# Patient Record
Sex: Female | Born: 1980 | Hispanic: No | Marital: Married | Smoking: Current every day smoker
Health system: Southern US, Community
[De-identification: ages and names within clinical notes are randomized; demographics above are authoritative.]

## PROBLEM LIST (undated history)

## (undated) DIAGNOSIS — F411 Generalized anxiety disorder: Secondary | ICD-10-CM

## (undated) DIAGNOSIS — K219 Gastro-esophageal reflux disease without esophagitis: Secondary | ICD-10-CM

## (undated) HISTORY — PX: TONSILLECTOMY: SUR1361

## (undated) HISTORY — PX: APPENDECTOMY: SHX54

## (undated) HISTORY — PX: WISDOM TOOTH EXTRACTION: SHX21

---
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
## 2013-03-18 ENCOUNTER — Emergency Department (INDEPENDENT_AMBULATORY_CARE_PROVIDER_SITE_OTHER)
Admission: EM | Admit: 2013-03-18 | Discharge: 2013-03-18 | Disposition: A | Discharge status: Home / Self Care | Payer: 59 | Source: Home / Self Care | Attending: Emergency Medicine | Admitting: Emergency Medicine

## 2013-03-18 ENCOUNTER — Encounter: Payer: Self-pay | Admitting: Emergency Medicine

## 2013-03-18 DIAGNOSIS — H109 Unspecified conjunctivitis: Secondary | ICD-10-CM

## 2013-03-18 DIAGNOSIS — Z23 Encounter for immunization: Secondary | ICD-10-CM

## 2013-03-18 HISTORY — DX: Gastro-esophageal reflux disease without esophagitis: K21.9

## 2013-03-18 MED ORDER — INFLUENZA VAC SPLIT QUAD 0.5 ML IM SUSP
0.5000 mL | Freq: Once | INTRAMUSCULAR | Status: AC
  Administered 2013-03-18: 0.5 mL via INTRAMUSCULAR

## 2013-03-18 MED ORDER — POLYMYXIN B-TRIMETHOPRIM 10000-0.1 UNIT/ML-% OP SOLN: OPHTHALMIC | Status: DC
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
## 2013-05-03 ENCOUNTER — Emergency Department (INDEPENDENT_AMBULATORY_CARE_PROVIDER_SITE_OTHER): Payer: 59

## 2013-05-03 ENCOUNTER — Emergency Department (INDEPENDENT_AMBULATORY_CARE_PROVIDER_SITE_OTHER)
Admission: EM | Admit: 2013-05-03 | Discharge: 2013-05-03 | Disposition: A | Discharge status: Home / Self Care | Payer: 59 | Source: Home / Self Care | Attending: Family Medicine | Admitting: Family Medicine

## 2013-05-03 ENCOUNTER — Encounter: Payer: Self-pay | Admitting: Emergency Medicine

## 2013-05-03 DIAGNOSIS — IMO0002 Reserved for concepts with insufficient information to code with codable children: Secondary | ICD-10-CM

## 2013-05-03 DIAGNOSIS — S3992XA Unspecified injury of lower back, initial encounter: Secondary | ICD-10-CM

## 2013-05-03 DIAGNOSIS — M545 Low back pain: Secondary | ICD-10-CM

## 2013-05-03 DIAGNOSIS — M533 Sacrococcygeal disorders, not elsewhere classified: Secondary | ICD-10-CM

## 2013-05-03 MED ORDER — HYDROCODONE-ACETAMINOPHEN 5-325 MG PO TABS: ORAL_TABLET | ORAL | Status: DC

## 2013-05-03 MED ORDER — METAXALONE 800 MG PO TABS: 800.0000 mg | ORAL_TABLET | Freq: Three times a day (TID) | ORAL | Status: DC

## 2013-05-03 MED ORDER — MELOXICAM 15 MG PO TABS: 15.0000 mg | ORAL_TABLET | Freq: Every day | ORAL | Status: DC

## 2013-05-03 MED ORDER — IBUPROFEN 800 MG PO TABS
800.0000 mg | ORAL_TABLET | Freq: Once | ORAL | Status: AC
  Administered 2013-05-03: 800 mg via ORAL

## 2013-05-03 NOTE — ED Notes (Signed)
Tammy Werner was bent over getting laundry out of the dryer yesterday AM, when she stood back up her back "caught" and she couldn't and hasnt been able to stand all the way up. Pain is in her lower back. No injury, no previous surgeries, etc. She has taken tylenol and and old rx of Vicodin for pain relief. Pain is 10/10 with activity and 5/10 @ rest.

## 2013-05-03 NOTE — ED Provider Notes (Signed)
CSN: [id number]     Arrival date & time 05/03/13  1133 History   First MD Initiated Contact with Patient 05/03/13 1152     Chief Complaint  Patient presents with  . Back Pain      HPI Comments:   Tammy Werner was bent over getting laundry out of the dryer yesterday AM.  When she stood back up her back "caught"  Pain is in her lower back worse on the left and she has had persistent lower back pain radiating to her buttocks.  Her pain is worse standing and sitting, and better in a fetal position on her side.  No bowel or bladder dysfunction.  Her pain improved with Tylenol and Vicodin.    Patient is a 32 y.o. female presenting with back pain. The history is provided by the patient.  Back Pain Location:  Lumbar spine Quality:  Burning and stabbing Radiates to: buttocs. Pain severity:  Severe Pain is:  Same all the time Onset quality:  Sudden Duration:  15 hours Timing:  Constant Progression:  Unchanged Chronicity:  New Relieved by:  Lying down and narcotics Worsened by:  Bending, ambulation, movement, sitting, standing and twisting Associated symptoms: no abdominal pain, no abdominal swelling, no bladder incontinence, no bowel incontinence, no chest pain, no dysuria, no fever, no headaches, no leg pain, no numbness, no paresthesias, no pelvic pain, no perianal numbness, no tingling, no weakness and no weight loss     Past Medical History  Diagnosis Date  . GERD (gastroesophageal reflux disease)    Past Surgical History  Procedure Laterality Date  . Tonsillectomy    . Appendectomy    . Wisdom tooth extraction     History reviewed. No pertinent family history. History  Substance Use Topics  . Smoking status: Current Every Day Smoker -- 0.50 packs/day    Types: Cigarettes  . Smokeless tobacco: Never Used  . Alcohol Use: Yes   OB History   Grav Para Term Preterm Abortions TAB SAB Ect Mult Living                 Review of Systems  Constitutional: Negative for fever and weight  loss.  Cardiovascular: Negative for chest pain.  Gastrointestinal: Negative for abdominal pain and bowel incontinence.  Genitourinary: Negative for bladder incontinence, dysuria and pelvic pain.  Musculoskeletal: Positive for back pain.  Neurological: Negative for tingling, weakness, numbness, headaches and paresthesias.    Allergies  Review of patient's allergies indicates no known allergies.  Home Medications   Current Outpatient Rx  Name  Route  Sig  Dispense  Refill  . HYDROcodone-acetaminophen (NORCO/VICODIN) 5-325 MG per tablet      Take one by mouth at bedtime as needed for pain   7 tablet   0   . meloxicam (MOBIC) 15 MG tablet   Oral   Take 1 tablet (15 mg total) by mouth daily. Take with food each morning   15 tablet   0   . metaxalone (SKELAXIN) 800 MG tablet   Oral   Take 1 tablet (800 mg total) by mouth 3 (three) times daily.   30 tablet   0    BP 125/77  Pulse 83  Resp 16  Ht 5\' 4"  (1.626 m)  Wt 145 lb (65.772 kg)  BMI 24.88 kg/m2  SpO2 100%  LMP 05/03/2013 Physical Exam  Nursing note and vitals reviewed. Constitutional: She is oriented to person, place, and time. She appears well-developed and well-nourished. No distress.  HENT:  Head: Atraumatic.  Eyes: Conjunctivae are normal. Pupils are equal, round, and reactive to light.  Neck: Normal range of motion.  Cardiovascular: Normal heart sounds.   Pulmonary/Chest: Breath sounds normal.  Abdominal: There is no tenderness.  Musculoskeletal:       Back:  Back:   Can heel/toe walk and squat without difficulty.  Decreased forward flexion.  Tenderness in the midline from approximately L1 to sacrum;  Tenderness bilateral paraspinous areas as noted on diagram.  Straight leg raising test is negative.  Sitting knee extension test is negative.  Strength and sensation in the lower extremities is normal.  Patellar and achilles reflexes are normal   Neurological: She is alert and oriented to person, place, and  time.  Skin: Skin is warm and dry. No rash noted.    ED Course  Procedures      Imaging Review Dg Lumbar Spine Complete  05/03/2013   CLINICAL DATA:  Low back pain without radicular symptoms.  EXAM: LUMBAR SPINE - COMPLETE 4+ VIEW  COMPARISON:  None.  FINDINGS: The lumbar vertebral bodies are preserved in height and the intervertebral disc space heights are reasonably well maintained. There is no pars defect nor spondylolisthesis. The pedicles and transverse processes appear intact. There is a moderate amount of stool within the visualized portions of the colon which may reflect constipation.  IMPRESSION: There is no acute bony abnormality of the lumbar spine nor evidence of significant degenerative change.   Electronically Signed   By: David  [place]   On: 05/03/2013 12:40      MDM   1. Back pain, lumbosacral   2. Lumbosacral injury, initial encounter    Begin Mobic and Skelaxin.  Lortab at bedtime prn. Apply ice pack 3 to 4 times daily for about 3 days.  Begin range of motion exercises in about 5 days ([facility] information and instruction handout given)  Followup with [facility] if not improving about two weeks.     Lattie Haw, MD 05/03/13 [phone]

## 2013-05-15 ENCOUNTER — Other Ambulatory Visit: Payer: Self-pay | Admitting: Family Medicine

## 2014-02-13 ENCOUNTER — Encounter: Payer: Self-pay | Admitting: Emergency Medicine

## 2014-02-13 ENCOUNTER — Emergency Department
Admission: EM | Admit: 2014-02-13 | Discharge: 2014-02-13 | Disposition: A | Discharge status: Home / Self Care | Payer: BC Managed Care – PPO | Source: Home / Self Care | Attending: Emergency Medicine | Admitting: Emergency Medicine

## 2014-02-13 DIAGNOSIS — H109 Unspecified conjunctivitis: Secondary | ICD-10-CM

## 2014-02-13 MED ORDER — CIPROFLOXACIN HCL 0.3 % OP SOLN: OPHTHALMIC | Status: DC

## 2014-02-13 NOTE — ED Provider Notes (Signed)
CSN: [id number]     Arrival date & time 02/13/14  0920 History   First MD Initiated Contact with Patient 02/13/14 [phone]     Chief Complaint  Patient presents with  . Eye Drainage   (Consider location/radiation/quality/duration/timing/severity/associated sxs/prior Treatment) Patient is a 33 y.o. female presenting with conjunctivitis. The history is provided by the patient. No language interpreter was used.  Conjunctivitis This is a new problem. The current episode started more than 1 week ago. The problem occurs constantly. The problem has been gradually worsening. Nothing aggravates the symptoms. Nothing relieves the symptoms. She has tried nothing for the symptoms.  Pt reports red eyes for a week.  Pt has been using her daughters eye drops,  Pt was getting better but now worsening.    Past Medical History  Diagnosis Date  . GERD (gastroesophageal reflux disease)    Past Surgical History  Procedure Laterality Date  . Tonsillectomy    . Appendectomy    . Wisdom tooth extraction     History reviewed. No pertinent family history. History  Substance Use Topics  . Smoking status: Former Smoker -- 0.50 packs/day    Types: Cigarettes  . Smokeless tobacco: Never Used  . Alcohol Use: Yes     Comment: occassionally   OB History   Grav Para Term Preterm Abortions TAB SAB Ect Mult Living                 Review of Systems  Eyes: Positive for pain and redness.  All other systems reviewed and are negative.   Allergies  Review of patient's allergies indicates no known allergies.  Home Medications   Prior to Admission medications   Medication Sig Start Date End Date Taking? Authorizing Provider  ciprofloxacin (CILOXAN) 0.3 % ophthalmic solution 1 drop, every 4 hours, while awake in both eyes, for the next 5 days. 02/13/14   Elson Areas, PA-C  HYDROcodone-acetaminophen (NORCO/VICODIN) 5-325 MG per tablet Take one by mouth at bedtime as needed for pain 05/03/13   Lattie Haw, MD   meloxicam (MOBIC) 15 MG tablet Take 1 tablet (15 mg total) by mouth daily. Take with food each morning 05/03/13   Lattie Haw, MD  metaxalone (SKELAXIN) 800 MG tablet Take 1 tablet (800 mg total) by mouth 3 (three) times daily. 05/03/13   Lattie Haw, MD   BP 118/73  Pulse 66  Temp(Src) 97.6 F (36.4 C) (Oral)  Ht  (1.626 m)  Wt 153 lb 8 oz (69.627 kg)  BMI 26.34 kg/m2  SpO2 98% Physical Exam  Vitals reviewed. Constitutional: She appears well-developed and well-nourished.  HENT:  Head: Normocephalic and atraumatic.  Right Ear: External ear normal.  Left Ear: External ear normal.  Eyes: Conjunctivae are normal. Pupils are equal, round, and reactive to light.  Injected bilat conjunctiva  No sign of ulcers, exudate corners  Neck: Normal range of motion. Neck supple.  Cardiovascular: Normal rate and normal heart sounds.   Abdominal: Soft.  Neurological: She is alert.  Skin: Skin is warm.    ED Course  Procedures (including critical care time) Labs Review Labs Reviewed - No data to display  Imaging Review No results found.   MDM   1. Bilateral conjunctivitis    ciprofloxin drops Benadryl at bedtime Pt advised if not improved in 2 days.  Return for recheck.    Lonia Skinner Alicia, PA-C 02/13/14 1047

## 2014-02-13 NOTE — Discharge Instructions (Signed)

## 2014-02-13 NOTE — ED Notes (Signed)
Started with itchy eyes on 9/14.  Progressed to goopy, itchy eyes stuck together in AM. Has been using daughter's Vigamox without success.  Sx have worsened.  Works at a computer all day.

## 2014-02-14 NOTE — ED Provider Notes (Signed)
Medical history/examination/treatment/procedure(s) were performed by non-physician provider and as supervising physician I was immediately available for consultation/collaboration.   David Massey, MD 02/14/14 1112 

## 2014-04-22 ENCOUNTER — Ambulatory Visit (INDEPENDENT_AMBULATORY_CARE_PROVIDER_SITE_OTHER): Payer: BC Managed Care – PPO | Admitting: Physician Assistant

## 2014-04-22 ENCOUNTER — Encounter: Payer: Self-pay | Admitting: Physician Assistant

## 2014-04-22 VITALS — BP 117/79 | HR 94 | Ht 64.0 in | Wt 159.0 lb

## 2014-04-22 DIAGNOSIS — Z720 Tobacco use: Secondary | ICD-10-CM | POA: Insufficient documentation

## 2014-04-22 DIAGNOSIS — Z23 Encounter for immunization: Secondary | ICD-10-CM

## 2014-04-22 DIAGNOSIS — Z716 Tobacco abuse counseling: Secondary | ICD-10-CM

## 2014-04-22 MED ORDER — VARENICLINE TARTRATE 0.5 MG X 11 & 1 MG X 42 PO MISC: ORAL | Status: DC

## 2014-04-22 NOTE — Patient Instructions (Signed)
Varenicline oral tablets What is this medicine? VARENICLINE (var EN i kleen) is used to help people quit smoking. It can reduce the symptoms caused by stopping smoking. It is used with a patient support program recommended by your physician. This medicine may be used for other purposes; ask your health care provider or pharmacist if you have questions. COMMON BRAND NAME(S): Chantix What should I tell my health care provider before I take this medicine? They need to know if you have any of these conditions: -bipolar disorder, depression, schizophrenia or other mental illness -heart disease -if you often drink alcohol -kidney disease -peripheral vascular disease -seizures -stroke -suicidal thoughts, plans, or attempt; a previous suicide attempt by you or a family member -an unusual or allergic reaction to varenicline, other medicines, foods, dyes, or preservatives -pregnant or trying to get pregnant -breast-feeding How should I use this medicine? You should set a date to stop smoking and tell your doctor. Start this medicine one week before the quit date. You can also start taking this medicine before you choose a quit date, and then pick a quit date that is between 8 and 35 days of treatment with this medicine. Stick to your plan; ask about support groups or other ways to help you remain a 'quitter'. Take this medicine by mouth after eating. Take with a full glass of water. Follow the directions on the prescription label. Take your doses at regular intervals. Do not take your medicine more often than directed. A special MedGuide will be given to you by the pharmacist with each prescription and refill. Be sure to read this information carefully each time. Talk to your pediatrician regarding the use of this medicine in children. This medicine is not approved for use in children. Overdosage: If you think you have taken too much of this medicine contact a poison control center or emergency room at  once. NOTE: This medicine is only for you. Do not share this medicine with others. What if I miss a dose? If you miss a dose, take it as soon as you can. If it is almost time for your next dose, take only that dose. Do not take double or extra doses. What may interact with this medicine? -alcohol or any product that contains alcohol -insulin -other stop smoking aids -theophylline -warfarin This list may not describe all possible interactions. Give your health care provider a list of all the medicines, herbs, non-prescription drugs, or dietary supplements you use. Also tell them if you smoke, drink alcohol, or use illegal drugs. Some items may interact with your medicine. What should I watch for while using this medicine? Visit your doctor or health care professional for regular check ups. Ask for ongoing advice and encouragement from your doctor or healthcare professional, friends, and family to help you quit. If you smoke while on this medication, quit again Your mouth may get dry. Chewing sugarless gum or sucking hard candy, and drinking plenty of water may help. Contact your doctor if the problem does not go away or is severe. You may get drowsy or dizzy. Do not drive, use machinery, or do anything that needs mental alertness until you know how this medicine affects you. Do not stand or sit up quickly, especially if you are an older patient. This reduces the risk of dizzy or fainting spells. The use of this medicine may increase the chance of suicidal thoughts or actions. Pay special attention to how you are responding while on this medicine. Any worsening of   mood, or thoughts of suicide or dying should be reported to your health care professional right away. What side effects may I notice from receiving this medicine? Side effects that you should report to your doctor or health care professional as soon as possible: -allergic reactions like skin rash, itching or hives, swelling of the face,  lips, tongue, or throat -breathing problems -changes in vision -chest pain or chest tightness -confusion, trouble speaking or understanding -fast, irregular heartbeat -feeling faint or lightheaded, falls -fever -pain in legs when walking -problems with balance, talking, walking -redness, blistering, peeling or loosening of the skin, including inside the mouth -ringing in ears -seizures -sudden numbness or weakness of the face, arm or leg -suicidal thoughts or other mood changes -trouble passing urine or change in the amount of urine -unusual bleeding or bruising -unusually weak or tired Side effects that usually do not require medical attention (report to your doctor or health care professional if they continue or are bothersome): -constipation -headache -nausea, vomiting -strange dreams -stomach gas -trouble sleeping This list may not describe all possible side effects. Call your doctor for medical advice about side effects. You may report side effects to FDA at 1-800-FDA-1088. Where should I keep my medicine? Keep out of the reach of children. Store at room temperature between 15 and 30 degrees C (59 and 86 degrees F). Throw away any unused medicine after the expiration date. NOTE: This sheet is a summary. It may not cover all possible information. If you have questions about this medicine, talk to your doctor, pharmacist, or health care provider.  2015, Elsevier/Gold Standard. (2013-02-18 13:37:47)  

## 2014-04-22 NOTE — Progress Notes (Signed)
   Subjective:    Patient ID: Tammy Werner, female    DOB: 10/30/1980, 33 y.o.   MRN: [id number]  HPI  Pt is a 33 yo female who presents to the clinic to establish care.   .. Active Ambulatory Problems    Diagnosis Date Noted  . Tobacco abuse 04/22/2014   Resolved Ambulatory Problems    Diagnosis Date Noted  . No Resolved Ambulatory Problems   Past Medical History  Diagnosis Date  . GERD (gastroesophageal reflux disease)    .Marland Kitchen. Family History  Problem Relation Age of Onset  . Alcohol abuse Father   . Alcohol abuse Maternal Aunt   . Diabetes Maternal Aunt   . Alcohol abuse Maternal Uncle   . Alcohol abuse Paternal Aunt   . Cancer Maternal Grandmother     skin  . Alcohol abuse Maternal Uncle   . Alcohol abuse Maternal Uncle   . Alcohol abuse Maternal Uncle    .Marland Kitchen. History   Social History  . Marital Status: Married    Spouse Name: N/A    Number of Children: N/A  . Years of Education: N/A   Occupational History  . Not on file.   Social History Main Topics  . Smoking status: Current Every Day Smoker -- 0.50 packs/day    Types: Cigarettes  . Smokeless tobacco: Never Used  . Alcohol Use: 0.0 oz/week    0 Not specified per week     Comment: occassionally  . Drug Use: Yes    Special: Marijuana  . Sexual Activity: Yes   Other Topics Concern  . Not on file   Social History Narrative   Pt wants to stop smoking. Quit once for 6 weeks with patch but then started back. Tried vaporizer but makes her cough more and lungs feel "wet". Smoked for 14 years about 10-12 cigs a day.       Review of Systems  All other systems reviewed and are negative.      Objective:   Physical Exam  Constitutional: She is oriented to person, place, and time. She appears well-developed and well-nourished.  HENT:  Head: Normocephalic and atraumatic.  Cardiovascular: Normal rate, regular rhythm and normal heart sounds.   Pulmonary/Chest: Effort normal and breath sounds normal.   Neurological: She is alert and oriented to person, place, and time.  Skin: Skin is dry.  Psychiatric: She has a normal mood and affect. Her behavior is normal.          Assessment & Plan:  Tobacco dependence- discussed smoking cessation options of patch vs zyban vs chantix. Pt would like to try chantix. Discussed side effects especially of vivid dreams and changes in mood. Pt aware. Gave starter pack with coupon. Discussed quit date for 7 days after start of pack if not able to quit at least decreasing weekly and quit as soon as possible before month one over.  Follow up in one month.  Discussed need for CPE/PAP.  Flu and tdap given without complication.

## 2014-05-13 ENCOUNTER — Telehealth: Payer: Self-pay

## 2014-05-13 NOTE — Telephone Encounter (Signed)
Agree with advice

## 2014-05-13 NOTE — Telephone Encounter (Signed)
Tammy Werner called to confirm that she could continue taking chantix along with [facility] Severe Daytime Cold [facility] Flu. Active ingredients; Dextromethorphan - phenylephrine - guaifenesin.  I advised her it is ok to take together, no known interaction. She was concerned because it states to not take with MAOI's. I advised her that MAOI's is an old antidepressant.

## 2014-05-20 ENCOUNTER — Encounter: Payer: Self-pay | Admitting: Physician Assistant

## 2014-05-20 ENCOUNTER — Ambulatory Visit: Payer: BC Managed Care – PPO | Admitting: Physician Assistant

## 2014-05-20 DIAGNOSIS — N879 Dysplasia of cervix uteri, unspecified: Secondary | ICD-10-CM | POA: Insufficient documentation

## 2014-05-21 ENCOUNTER — Encounter: Payer: Self-pay | Admitting: Physician Assistant

## 2014-05-21 ENCOUNTER — Ambulatory Visit (INDEPENDENT_AMBULATORY_CARE_PROVIDER_SITE_OTHER): Payer: BC Managed Care – PPO | Admitting: Physician Assistant

## 2014-05-21 VITALS — BP 128/84 | HR 80 | Ht 64.0 in | Wt 162.0 lb

## 2014-05-21 DIAGNOSIS — Z720 Tobacco use: Secondary | ICD-10-CM | POA: Diagnosis not present

## 2014-05-21 MED ORDER — VARENICLINE TARTRATE 0.5 MG PO TABS: 0.5000 mg | ORAL_TABLET | Freq: Two times a day (BID) | ORAL | Status: DC

## 2014-05-21 NOTE — Progress Notes (Signed)
   Subjective:    Patient ID: Tammy Werner, female    DOB: 1981/01/09, 33 y.o.   MRN: [id number]  HPI Pt is a 33 yo female who presents to the clinic to follow up on tobacco abuse. She started chantix 4 weeks ago. Stopped smoking 05/02/14. She has had a few weird dreams but overall no problems with medication. She feels great and ready to continue with therapy.    Review of Systems  All other systems reviewed and are negative.      Objective:   Physical Exam  Constitutional: She is oriented to person, place, and time. She appears well-developed and well-nourished.  HENT:  Head: Normocephalic and atraumatic.  Cardiovascular: Normal rate, regular rhythm and normal heart sounds.   Pulmonary/Chest: Effort normal and breath sounds normal. She has no wheezes.  Neurological: She is alert and oriented to person, place, and time.  Skin: Skin is dry.  Psychiatric: She has a normal mood and affect. Her behavior is normal.          Assessment & Plan:  Tobacco abuse- former smoker. Will continue chantix for next 2 months. Pt will finish 12 week therapy.  Follow up as needed.

## 2014-05-21 NOTE — Patient Instructions (Signed)
Smoking Cessation Quitting smoking is important to your health and has many advantages. However, it is not always easy to quit since nicotine is a very addictive drug. Oftentimes, people try 3 times or more before being able to quit. This document explains the best ways for you to prepare to quit smoking. Quitting takes hard work and a lot of effort, but you can do it. ADVANTAGES OF QUITTING SMOKING  You will live longer, feel better, and live better.  Your body will feel the impact of quitting smoking almost immediately.  Within 20 minutes, blood pressure decreases. Your pulse returns to its normal level.  After 8 hours, carbon monoxide levels in the blood return to normal. Your oxygen level increases.  After 24 hours, the chance of having a heart attack starts to decrease. Your breath, hair, and body stop smelling like smoke.  After 48 hours, damaged nerve endings begin to recover. Your sense of taste and smell improve.  After 72 hours, the body is virtually free of nicotine. Your bronchial tubes relax and breathing becomes easier.  After 2 to 12 weeks, lungs can hold more air. Exercise becomes easier and circulation improves.  The risk of having a heart attack, stroke, cancer, or lung disease is greatly reduced.  After 1 year, the risk of coronary heart disease is cut in half.  After 5 years, the risk of stroke falls to the same as a nonsmoker.  After 10 years, the risk of lung cancer is cut in half and the risk of other cancers decreases significantly.  After 15 years, the risk of coronary heart disease drops, usually to the level of a nonsmoker.  If you are pregnant, quitting smoking will improve your chances of having a healthy baby.  The people you live with, especially any children, will be healthier.  You will have extra money to spend on things other than cigarettes. QUESTIONS TO THINK ABOUT BEFORE ATTEMPTING TO QUIT You may want to talk about your answers with your  health care provider.  Why do you want to quit?  If you tried to quit in the past, what helped and what did not?  What will be the most difficult situations for you after you quit? How will you plan to handle them?  Who can help you through the tough times? Your family? Friends? A health care provider?  What pleasures do you get from smoking? What ways can you still get pleasure if you quit? Here are some questions to ask your health care provider:  How can you help me to be successful at quitting?  What medicine do you think would be best for me and how should I take it?  What should I do if I need more help?  What is smoking withdrawal like? How can I get information on withdrawal? GET READY  Set a quit date.  Change your environment by getting rid of all cigarettes, ashtrays, matches, and lighters in your home, car, or work. Do not let people smoke in your home.  Review your past attempts to quit. Think about what worked and what did not. GET SUPPORT AND ENCOURAGEMENT You have a better chance of being successful if you have help. You can get support in many ways.  Tell your family, friends, and coworkers that you are going to quit and need their support. Ask them not to smoke around you.  Get individual, group, or telephone counseling and support. Programs are available at local hospitals and health centers. Call   your local health department for information about programs in your area.  Spiritual beliefs and practices may help some smokers quit.  Download a "quit meter" on your computer to keep track of quit statistics, such as how long you have gone without smoking, cigarettes not smoked, and money saved.  Get a self-help book about quitting smoking and staying off tobacco. LEARN NEW SKILLS AND BEHAVIORS  Distract yourself from urges to smoke. Talk to someone, go for a walk, or occupy your time with a task.  Change your normal routine. Take a different route to work.  Drink tea instead of coffee. Eat breakfast in a different place.  Reduce your stress. Take a hot bath, exercise, or read a book.  Plan something enjoyable to do every day. Reward yourself for not smoking.  Explore interactive web-based programs that specialize in helping you quit. GET MEDICINE AND USE IT CORRECTLY Medicines can help you stop smoking and decrease the urge to smoke. Combining medicine with the above behavioral methods and support can greatly increase your chances of successfully quitting smoking.  Nicotine replacement therapy helps deliver nicotine to your body without the negative effects and risks of smoking. Nicotine replacement therapy includes nicotine gum, lozenges, inhalers, nasal sprays, and skin patches. Some may be available over-the-counter and others require a prescription.  Antidepressant medicine helps people abstain from smoking, but how this works is unknown. This medicine is available by prescription.  Nicotinic receptor partial agonist medicine simulates the effect of nicotine in your brain. This medicine is available by prescription. Ask your health care provider for advice about which medicines to use and how to use them based on your health history. Your health care provider will tell you what side effects to look out for if you choose to be on a medicine or therapy. Carefully read the information on the package. Do not use any other product containing nicotine while using a nicotine replacement product.  RELAPSE OR DIFFICULT SITUATIONS Most relapses occur within the first 3 months after quitting. Do not be discouraged if you start smoking again. Remember, most people try several times before finally quitting. You may have symptoms of withdrawal because your body is used to nicotine. You may crave cigarettes, be irritable, feel very hungry, cough often, get headaches, or have difficulty concentrating. The withdrawal symptoms are only temporary. They are strongest  when you first quit, but they will go away within 10-14 days. To reduce the chances of relapse, try to:  Avoid drinking alcohol. Drinking lowers your chances of successfully quitting.  Reduce the amount of caffeine you consume. Once you quit smoking, the amount of caffeine in your body increases and can give you symptoms, such as a rapid heartbeat, sweating, and anxiety.  Avoid smokers because they can make you want to smoke.  Do not let weight gain distract you. Many smokers will gain weight when they quit, usually less than 10 pounds. Eat a healthy diet and stay active. You can always lose the weight gained after you quit.  Find ways to improve your mood other than smoking. FOR MORE INFORMATION  www.smokefree.gov  Document Released: 05/03/2001 Document Revised: 09/23/2013 Document Reviewed: 08/18/2011 [employer] Patient Information 2015 [employer]. This information is not intended to replace advice given to you by your health care provider. Make sure you discuss any questions you have with your health care provider.  

## 2014-08-16 ENCOUNTER — Other Ambulatory Visit: Payer: Self-pay | Admitting: Physician Assistant

## 2015-08-28 ENCOUNTER — Encounter: Payer: Self-pay | Admitting: *Deleted

## 2015-08-28 ENCOUNTER — Emergency Department (INDEPENDENT_AMBULATORY_CARE_PROVIDER_SITE_OTHER)
Admission: EM | Admit: 2015-08-28 | Discharge: 2015-08-28 | Disposition: A | Discharge status: Home / Self Care | Payer: Managed Care, Other (non HMO) | Source: Home / Self Care | Attending: Family Medicine | Admitting: Family Medicine

## 2015-08-28 DIAGNOSIS — R319 Hematuria, unspecified: Secondary | ICD-10-CM

## 2015-08-28 DIAGNOSIS — N390 Urinary tract infection, site not specified: Secondary | ICD-10-CM

## 2015-08-28 LAB — POCT URINALYSIS DIP (MANUAL ENTRY)
Bilirubin, UA: NEGATIVE
Glucose, UA: NEGATIVE
Ketones, POC UA: NEGATIVE
Nitrite, UA: NEGATIVE
Protein Ur, POC: 30 — AB
Spec Grav, UA: >=1.03 (ref 1.005–1.03)
Urobilinogen, UA: 0.2 (ref 0–1)
pH, UA: 5.5 (ref 5–8)

## 2015-08-28 LAB — POCT URINE PREGNANCY: Preg Test, Ur: NEGATIVE

## 2015-08-28 MED ORDER — CEPHALEXIN 500 MG PO CAPS: 500.0000 mg | ORAL_CAPSULE | Freq: Two times a day (BID) | ORAL | Status: DC

## 2015-08-28 NOTE — ED Notes (Signed)
Pt c/o 2 days of dysuria, cloudy urine and bladder and flank pain at times. Afebrile. Taken IBF otc. Active trying to conceive.

## 2015-08-28 NOTE — Discharge Instructions (Signed)

## 2015-08-28 NOTE — ED Provider Notes (Signed)
CSN: [id number]     Arrival date & time 08/28/15  0930 History   First MD Initiated Contact with Patient 08/28/15 [phone]     Chief Complaint  Patient presents with  . Dysuria   (Consider location/radiation/quality/duration/timing/severity/associated sxs/prior Treatment) HPI The pt is a 35yo female presenting to [facility] with c/o 2 days of mild dysuria, gradually worsening with associated cloudy urine, Right sided flank pain and intermittent bladder spasms.  She has taken ibuprofen with moderate relief. She notes she is trying to conceive.  She has not had a UTI in several years. Denies fever, chills, n/v/d.   Past Medical History  Diagnosis Date  . GERD (gastroesophageal reflux disease)    Past Surgical History  Procedure Laterality Date  . Tonsillectomy    . Appendectomy    . Wisdom tooth extraction     Family History  Problem Relation Age of Onset  . Alcohol abuse Father   . Alcohol abuse Maternal Aunt   . Diabetes Maternal Aunt   . Alcohol abuse Maternal Uncle   . Alcohol abuse Paternal Aunt   . Cancer Maternal Grandmother     skin  . Alcohol abuse Maternal Uncle   . Alcohol abuse Maternal Uncle   . Alcohol abuse Maternal Uncle    Social History  Substance Use Topics  . Smoking status: Former Smoker -- 0.50 packs/day    Types: Cigarettes  . Smokeless tobacco: Never Used  . Alcohol Use: 0.0 oz/week    0 Standard drinks or equivalent per week     Comment: occassionally   OB History    No data available     Review of Systems  Constitutional: Negative for fever and chills.  Gastrointestinal: Positive for abdominal pain (bladder cramping). Negative for nausea, vomiting and diarrhea.  Genitourinary: Positive for dysuria, urgency, frequency and flank pain (Right). Negative for hematuria, menstrual problem and pelvic pain.  Musculoskeletal: Negative for myalgias, back pain and arthralgias.    Allergies  Review of patient's allergies indicates no known allergies.  Home  Medications   Prior to Admission medications   Medication Sig Start Date End Date Taking? Authorizing Provider  cephALEXin (KEFLEX) 500 MG capsule Take 1 capsule (500 mg total) by mouth 2 (two) times daily. For 7 days 08/28/15   Junius Finner, PA-C   Meds Ordered and Administered this Visit  Medications - No data to display  BP 107/69 mmHg  Pulse 80  Temp(Src) 97.7 F (36.5 C)  Wt 137 lb (62.143 kg)  SpO2 99%  LMP 08/14/2015 No data found.   Physical Exam  Constitutional: She appears well-developed and well-nourished. No distress.  HENT:  Head: Normocephalic and atraumatic.  Mouth/Throat: Oropharynx is clear and moist.  Eyes: Conjunctivae are normal. No scleral icterus.  Neck: Normal range of motion.  Cardiovascular: Normal rate, regular rhythm and normal heart sounds.   Pulmonary/Chest: Effort normal and breath sounds normal. No respiratory distress. She has no wheezes. She has no rales.  Abdominal: Soft. She exhibits no distension and no mass. There is no tenderness. There is no rebound, no guarding and no CVA tenderness.  Musculoskeletal: Normal range of motion.  Neurological: She is alert.  Skin: Skin is warm and dry. She is not diaphoretic.  Nursing note and vitals reviewed.   ED Course  Procedures (including critical care time)  Labs Review Labs Reviewed  POCT URINALYSIS DIP (MANUAL ENTRY) - Abnormal; Notable for the following:    Clarity, UA cloudy (*)    Blood, UA  moderate (*)    Protein Ur, POC =30 (*)    Leukocytes, UA moderate (2+) (*)    All other components within normal limits  URINE CULTURE  POCT URINE PREGNANCY    Imaging Review No results found.     MDM   1. Urinary tract infection with hematuria, site unspecified    Pt c/o UTI symptoms that started 2 days ago. Currently trying to conceive.  Pt is afebrile. Moist mucous membranes. Abdominal exam- benign. No CVAT  UA: c/w UTI, will send culture  Rx: Keflex  Encouraged to stay well  hydrated. F/u with OB/GYN in 4-5 days if not improving, sooner if worsening. Patient verbalized understanding and agreement with treatment plan.     Junius Finnerrin O'Malley, PA-C 08/28/15 1038

## 2015-08-30 ENCOUNTER — Telehealth: Payer: Self-pay | Admitting: Emergency Medicine

## 2015-08-30 LAB — URINE CULTURE: Colony Count: >=100000

## 2017-07-10 ENCOUNTER — Emergency Department (INDEPENDENT_AMBULATORY_CARE_PROVIDER_SITE_OTHER)
Admission: EM | Admit: 2017-07-10 | Discharge: 2017-07-10 | Disposition: A | Discharge status: Home / Self Care | Payer: Managed Care, Other (non HMO) | Source: Home / Self Care | Attending: Family Medicine | Admitting: Family Medicine

## 2017-07-10 ENCOUNTER — Other Ambulatory Visit: Payer: Self-pay

## 2017-07-10 DIAGNOSIS — R05 Cough: Secondary | ICD-10-CM

## 2017-07-10 DIAGNOSIS — R053 Chronic cough: Secondary | ICD-10-CM

## 2017-07-10 DIAGNOSIS — J069 Acute upper respiratory infection, unspecified: Secondary | ICD-10-CM

## 2017-07-10 MED ORDER — BENZONATATE 100 MG PO CAPS: 100.0000 mg | ORAL_CAPSULE | Freq: Three times a day (TID) | ORAL | 0 refills | Status: DC

## 2017-07-10 MED ORDER — AZITHROMYCIN 250 MG PO TABS: 250.0000 mg | ORAL_TABLET | Freq: Every day | ORAL | 0 refills | Status: DC

## 2017-07-10 NOTE — Discharge Instructions (Signed)
°  You may take 500mg acetaminophen every 4-6 hours or in combination with ibuprofen 400-600mg every 6-8 hours as needed for pain, inflammation, and fever. ° °Be sure to drink at least eight 8oz glasses of water to stay well hydrated and get at least 8 hours of sleep at night, preferably more while sick.  ° °Please take antibiotics as prescribed and be sure to complete entire course even if you start to feel better to ensure infection does not come back. ° °

## 2017-07-10 NOTE — ED Triage Notes (Signed)
Pt has had a deep cough x 1 month.  Sore throat and headache has been for a month off and on also.  Coughing to the point of vomiting last night.  Husband and children have been DX with the flu.

## 2017-07-10 NOTE — ED Provider Notes (Signed)
[facility]    CSN: [id number] Arrival date & time: 07/10/17  1746     History   Chief Complaint Chief Complaint  Patient presents with  . Sore Throat  . Cough  . Nausea  . Otalgia    HPI Tammy Werner is a 37 y.o. female.   HPI Tammy Werner is a 37 y.o. female presenting to UC with c/o productive cough for the last 1 month.  She has also had intermittent sore throat and HA but states symptoms have been waxing and waning, she thought she would eventually get better. Last night she vomited once.  Nausea has gradually improved today with a bland diet but her husband encouraged her to be evaluated because he was dx with the flu along with pt's children.  Denies denies known fever. Denies chest pain or SOB.   Past Medical History:  Diagnosis Date  . GERD (gastroesophageal reflux disease)     Patient Active Problem List   Diagnosis Date Noted  . 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 Tobacco abuse 04/22/2014    Past Surgical History:  Procedure Laterality Date  . APPENDECTOMY    . TONSILLECTOMY    . WISDOM TOOTH EXTRACTION      OB History    No data available       Home Medications    Prior to Admission medications   Medication Sig Start Date End Date Taking? Authorizing Provider  azithromycin (ZITHROMAX) 250 MG tablet Take 1 tablet (250 mg total) by mouth daily. Take first 2 tablets together, then 1 every day until finished. 07/10/17   Lurene Shadow, PA-C  benzonatate (TESSALON) 100 MG capsule Take 1-2 capsules (100-200 mg total) by mouth every 8 (eight) hours. 07/10/17   Lurene Shadow, PA-C  cephALEXin (KEFLEX) 500 MG capsule Take 1 capsule (500 mg total) by mouth 2 (two) times daily. For 7 days 08/28/15   Rolla Plate    Family History Family History  Problem Relation Age of Onset  . Alcohol abuse Father   . Alcohol abuse Maternal Aunt   . Diabetes Maternal Aunt   . Alcohol abuse Maternal Uncle   . Alcohol abuse Paternal Aunt   . Cancer  Maternal Grandmother        skin  . Alcohol abuse Maternal Uncle   . Alcohol abuse Maternal Uncle   . Alcohol abuse Maternal Uncle     Social History Social History   Tobacco Use  . Smoking status: Former Smoker    Packs/day: 0.50    Types: Cigarettes  . Smokeless tobacco: Never Used  Substance Use Topics  . Alcohol use: Yes    Alcohol/week: 0.0 oz    Comment: occassionally  . Drug use: Yes    Types: Marijuana     Allergies   Patient has no known allergies.   Review of Systems Review of Systems  Constitutional: Positive for fatigue. Negative for chills and fever.  HENT: Positive for congestion, postnasal drip and sore throat. Negative for ear pain, trouble swallowing and voice change.   Respiratory: Positive for cough. Negative for shortness of breath.   Cardiovascular: Negative for chest pain and palpitations.  Gastrointestinal: Positive for nausea and vomiting. Negative for abdominal pain and diarrhea.  Musculoskeletal: Negative for arthralgias, back pain and myalgias.  Skin: Negative for rash.  Neurological: Positive for headaches. Negative for dizziness and light-headedness.     Physical Exam Triage Vital Signs ED Triage Vitals [07/10/17 1815]  Enc Vitals Group     BP 109/71     Pulse Rate 96     Resp      Temp 98.4 F (36.9 C)     Temp Source Oral     SpO2 97 %     Weight 160 lb (72.6 kg)     Height 5\' 4"  (1.626 m)     Head Circumference      Peak Flow      Pain Score 0     Pain Loc      Pain Edu?      Excl. in GC?    No data found.  Updated Vital Signs BP 109/71 (BP Location: Right Arm)   Pulse 96   Temp 98.4 F (36.9 C) (Oral)   Ht 5\' 4"  (1.626 m)   Wt 160 lb (72.6 kg)   LMP 07/06/2017   SpO2 97%   BMI 27.46 kg/m   Visual Acuity Right Eye Distance:   Left Eye Distance:   Bilateral Distance:    Right Eye Near:   Left Eye Near:    Bilateral Near:     Physical Exam  Constitutional: She is oriented to person, place, and time. She  appears well-developed and well-nourished.  Non-toxic appearance. She does not appear ill. No distress.  HENT:  Head: Normocephalic and atraumatic.  Right Ear: Tympanic membrane normal.  Left Ear: Tympanic membrane normal.  Nose: Mucosal edema present. Right sinus exhibits no maxillary sinus tenderness and no frontal sinus tenderness. Left sinus exhibits no maxillary sinus tenderness and no frontal sinus tenderness.  Mouth/Throat: Uvula is midline, oropharynx is clear and moist and mucous membranes are normal.  Eyes: EOM are normal.  Neck: Normal range of motion. Neck supple.  Cardiovascular: Normal rate and regular rhythm.  Pulmonary/Chest: Effort normal and breath sounds normal. No stridor. No respiratory distress. She has no wheezes. She has no rhonchi. She has no rales.  Musculoskeletal: Normal range of motion.  Lymphadenopathy:    She has no cervical adenopathy.  Neurological: She is alert and oriented to person, place, and time.  Skin: Skin is warm and dry.  Psychiatric: She has a normal mood and affect. Her behavior is normal.  Nursing note and vitals reviewed.    UC Treatments / Results  Labs (all labs ordered are listed, but only abnormal results are displayed) Labs Reviewed - No data to display  EKG  EKG Interpretation None       Radiology No results found.  Procedures Procedures (including critical care time)  Medications Ordered in UC Medications - No data to display   Initial Impression / Assessment and Plan / UC Course  I have reviewed the triage vital signs and the nursing notes.  Pertinent labs & imaging results that were available during my care of the patient were reviewed by me and considered in my medical decision making (see chart for details).     Will cover for underlying bacterial infection given duration of symptoms F/u with PCP in 1 week as needed.   Final Clinical Impressions(s) / UC Diagnoses   Final diagnoses:  Persistent cough for  3 weeks or longer  Upper respiratory tract infection, unspecified type    ED Discharge Orders        Ordered    benzonatate (TESSALON) 100 MG capsule  Every 8 hours     07/10/17 1841    azithromycin (ZITHROMAX) 250 MG tablet  Daily     07/10/17 1841  Controlled Substance Prescriptions [place] Controlled Substance Registry consulted? Not Applicable   Rolla Plate 07/10/17 1921

## 2018-03-08 ENCOUNTER — Emergency Department (INDEPENDENT_AMBULATORY_CARE_PROVIDER_SITE_OTHER)
Admission: EM | Admit: 2018-03-08 | Discharge: 2018-03-08 | Disposition: A | Discharge status: Home / Self Care | Payer: Managed Care, Other (non HMO) | Source: Home / Self Care

## 2018-03-08 ENCOUNTER — Other Ambulatory Visit: Payer: Self-pay

## 2018-03-08 ENCOUNTER — Other Ambulatory Visit (HOSPITAL_COMMUNITY)
Admission: RE | Admit: 2018-03-08 | Discharge: 2018-03-08 | Disposition: A | Discharge status: Home / Self Care | Payer: Managed Care, Other (non HMO) | Source: Ambulatory Visit | Attending: Family Medicine | Admitting: Family Medicine

## 2018-03-08 ENCOUNTER — Encounter: Payer: Self-pay | Admitting: Family Medicine

## 2018-03-08 DIAGNOSIS — Z202 Contact with and (suspected) exposure to infections with a predominantly sexual mode of transmission: Secondary | ICD-10-CM | POA: Insufficient documentation

## 2018-03-08 DIAGNOSIS — N939 Abnormal uterine and vaginal bleeding, unspecified: Secondary | ICD-10-CM | POA: Insufficient documentation

## 2018-03-08 MED ORDER — AZITHROMYCIN 1 G PO PACK: 1.0000 g | PACK | Freq: Once | ORAL | 0 refills | Status: AC

## 2018-03-08 NOTE — Discharge Instructions (Addendum)
At this point we do not know the cause of your spotting.  This could very well be the new birth control pill.  I written a prescription for antibiotic in case any test come back positive.  We will notify you if the test is positive in the next 24 to 48 hours.  Okay oh okay

## 2018-03-08 NOTE — ED Triage Notes (Signed)
Patient reports finishing her menstrual cycle 3 days ago. Since, she has has bleeding with intercourse. Reports "normal discharge". Requests a panel of std testing.

## 2018-03-08 NOTE — ED Provider Notes (Signed)
[facility]    CSN: [id number] Arrival date & time: 03/08/18  1444     History   Chief Complaint Chief Complaint  Patient presents with  . Vaginal Bleeding  . STD Testing    HPI Tammy Werner is a 37 y.o. female.   This is a 37 year old G2, P2 woman who comes in asking for STD testing.  She has been having spotting for the last 2 weeks.  Her last menstrual.  Was about a month ago.  She is recently started on a new birth control pill.  She is also having some marital discord and so she wants to make sure that she has not contracted an STD.  Patient denies any fever, abdominal pain, or discharge.  She would like to be tested for everything.     Past Medical History:  Diagnosis Date  . GERD (gastroesophageal reflux disease)     Patient Active Problem List   Diagnosis Date Noted  . 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 Tobacco abuse 04/22/2014    Past Surgical History:  Procedure Laterality Date  . APPENDECTOMY    . TONSILLECTOMY    . WISDOM TOOTH EXTRACTION      OB History   None      Home Medications    Prior to Admission medications   Medication Sig Start Date End Date Taking? Authorizing Provider  azithromycin (ZITHROMAX) 1 g powder Take 1 packet (1 g total) by mouth once for 1 dose. 03/08/18 03/08/18  Elvina Sidle, MD    Family History Family History  Problem Relation Age of Onset  . Alcohol abuse Father   . Alcohol abuse Maternal Aunt   . Diabetes Maternal Aunt   . Alcohol abuse Maternal Uncle   . Alcohol abuse Paternal Aunt   . Cancer Maternal Grandmother        skin  . Alcohol abuse Maternal Uncle   . Alcohol abuse Maternal Uncle   . Alcohol abuse Maternal Uncle     Social History Social History   Tobacco Use  . Smoking status: Current Every Day Smoker    Packs/day: 0.50    Types: Cigarettes  . Smokeless tobacco: Never Used  Substance Use Topics  . Alcohol use: Yes    Alcohol/week: 0.0 standard drinks    Comment:  occassionally  . Drug use: Not Currently     Allergies   Patient has no known allergies.   Review of Systems Review of Systems  Genitourinary: Positive for vaginal bleeding.  All other systems reviewed and are negative.    Physical Exam Triage Vital Signs ED Triage Vitals  Enc Vitals Group     BP      Pulse      Resp      Temp      Temp src      SpO2      Weight      Height      Head Circumference      Peak Flow      Pain Score      Pain Loc      Pain Edu?      Excl. in GC?    No data found.  Updated Vital Signs BP 128/81 (BP Location: Right Arm)   Pulse (!) 103   Temp 98.7 F (37.1 C) (Oral)   Resp 14   Wt 66.2 kg   LMP 02/25/2018   SpO2 99%   BMI 25.06 kg/m    Physical  Exam  Constitutional: She is oriented to person, place, and time. She appears well-developed and well-nourished.  HENT:  Right Ear: External ear normal.  Left Ear: External ear normal.  Mouth/Throat: Oropharynx is clear and moist.  Eyes: Pupils are equal, round, and reactive to light. Conjunctivae are normal.  Neck: Normal range of motion. Neck supple.  Musculoskeletal: Normal range of motion.  Neurological: She is alert and oriented to person, place, and time.  Skin: Skin is warm and dry.  Psychiatric: She has a normal mood and affect. Her behavior is normal. Judgment and thought content normal.  Nursing note and vitals reviewed.    UC Treatments / Results  Labs (all labs ordered are listed, but only abnormal results are displayed) Labs Reviewed  CERVICOVAGINAL ANCILLARY ONLY    EKG None  Radiology No results found.  Procedures Procedures (including critical care time)  Medications Ordered in UC Medications - No data to display  Initial Impression / Assessment and Plan / UC Course  I have reviewed the triage vital signs and the nursing notes.  Pertinent labs & imaging results that were available during my care of the patient were reviewed by me and considered in  my medical decision making (see chart for details).    Final Clinical Impressions(s) / UC Diagnoses   Final diagnoses:  Vaginal bleeding     Discharge Instructions     At this point we do not know the cause of your spotting.  This could very well be the new birth control pill.  I written a prescription for antibiotic in case any test come back positive.  We will notify you if the test is positive in the next 24 to 48 hours.  Okay oh okay    ED Prescriptions    Medication Sig Dispense Auth. Provider   azithromycin (ZITHROMAX) 1 g powder Take 1 packet (1 g total) by mouth once for 1 dose. 1 each Elvina Sidle, MD     Controlled Substance Prescriptions [place] Controlled Substance Registry consulted? Not Applicable   Elvina Sidle, MD 03/08/18 [phone]

## 2018-03-09 LAB — CERVICOVAGINAL ANCILLARY ONLY
Bacterial vaginitis: NEGATIVE
Candida vaginitis: NEGATIVE
Chlamydia: NEGATIVE
Neisseria Gonorrhea: NEGATIVE
Trichomonas: NEGATIVE

## 2018-03-09 LAB — HIV ANTIBODY (ROUTINE TESTING W REFLEX): HIV 1&2 Ab, 4th Generation: NONREACTIVE

## 2018-03-09 LAB — RPR: RPR Ser Ql: NONREACTIVE

## 2018-03-10 ENCOUNTER — Telehealth: Payer: Self-pay | Admitting: Emergency Medicine

## 2018-03-10 NOTE — Telephone Encounter (Signed)
Patient returned phone call for lab results.  After identity was established, patient informed that all of her labs were negative.  Patient voices understanding.

## 2018-07-16 DIAGNOSIS — N898 Other specified noninflammatory disorders of vagina: Secondary | ICD-10-CM | POA: Diagnosis not present

## 2018-07-16 DIAGNOSIS — Z3202 Encounter for pregnancy test, result negative: Secondary | ICD-10-CM | POA: Diagnosis not present

## 2018-07-16 DIAGNOSIS — N925 Other specified irregular menstruation: Secondary | ICD-10-CM | POA: Diagnosis not present

## 2018-07-25 DIAGNOSIS — Z13220 Encounter for screening for lipoid disorders: Secondary | ICD-10-CM | POA: Diagnosis not present

## 2018-07-25 DIAGNOSIS — G44229 Chronic tension-type headache, not intractable: Secondary | ICD-10-CM | POA: Diagnosis not present

## 2018-07-25 DIAGNOSIS — Z131 Encounter for screening for diabetes mellitus: Secondary | ICD-10-CM | POA: Diagnosis not present

## 2018-07-25 DIAGNOSIS — F411 Generalized anxiety disorder: Secondary | ICD-10-CM | POA: Diagnosis not present

## 2018-08-20 DIAGNOSIS — F411 Generalized anxiety disorder: Secondary | ICD-10-CM | POA: Diagnosis not present

## 2018-08-20 DIAGNOSIS — G44229 Chronic tension-type headache, not intractable: Secondary | ICD-10-CM | POA: Diagnosis not present

## 2018-08-20 DIAGNOSIS — Z309 Encounter for contraceptive management, unspecified: Secondary | ICD-10-CM | POA: Diagnosis not present

## 2018-09-13 ENCOUNTER — Emergency Department (INDEPENDENT_AMBULATORY_CARE_PROVIDER_SITE_OTHER)
Admission: EM | Admit: 2018-09-13 | Discharge: 2018-09-13 | Disposition: A | Discharge status: Home / Self Care | Payer: BLUE CROSS/BLUE SHIELD | Source: Home / Self Care

## 2018-09-13 ENCOUNTER — Other Ambulatory Visit: Payer: Self-pay

## 2018-09-13 DIAGNOSIS — N3090 Cystitis, unspecified without hematuria: Secondary | ICD-10-CM | POA: Diagnosis not present

## 2018-09-13 LAB — POCT URINALYSIS DIP (MANUAL ENTRY)
Bilirubin, UA: NEGATIVE
Glucose, UA: NEGATIVE mg/dL
Ketones, POC UA: NEGATIVE mg/dL
Nitrite, UA: NEGATIVE
Protein Ur, POC: NEGATIVE mg/dL
Spec Grav, UA: 1.01 (ref 1.010–1.025)
Urobilinogen, UA: 0.2 E.U./dL
pH, UA: 7 (ref 5.0–8.0)

## 2018-09-13 MED ORDER — PHENAZOPYRIDINE HCL 200 MG PO TABS: 200.0000 mg | ORAL_TABLET | Freq: Three times a day (TID) | ORAL | 0 refills | Status: DC

## 2018-09-13 MED ORDER — SULFAMETHOXAZOLE-TRIMETHOPRIM 800-160 MG PO TABS: 1.0000 | ORAL_TABLET | Freq: Two times a day (BID) | ORAL | 0 refills | Status: DC

## 2018-09-13 NOTE — ED Triage Notes (Signed)
Pt c/o UTI sxs that started yesterday. Urinary frequency and burning. Drinking cranberry juice.

## 2018-09-13 NOTE — ED Provider Notes (Signed)
[facility]    CSN: [id number] Arrival date & time: 09/13/18  1709     History   Chief Complaint Chief Complaint  Patient presents with  . Urinary Tract Infection    HPI Tammy Werner is a 38 y.o. female.   HPI Patient has had dysuria for the past 2 or 3 days.  Less than a week ago she had had intercourse, and went on to sleep without urinating.  We discussed the need for that.  She has had several UTIs in the past.  She denies any fever chills or flank pain.  Had some nausea. Past Medical History:  Diagnosis Date  . GERD (gastroesophageal reflux disease)     Patient Active Problem List   Diagnosis Date Noted  . 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 Tobacco abuse 04/22/2014    Past Surgical History:  Procedure Laterality Date  . APPENDECTOMY    . TONSILLECTOMY    . WISDOM TOOTH EXTRACTION      OB History   No obstetric history on file.      Home Medications    Prior to Admission medications   Medication Sig Start Date End Date Taking? Authorizing Provider  escitalopram (LEXAPRO) 10 MG tablet Take 10 mg by mouth daily.   Yes [provider]  etonogestrel-ethinyl estradiol (NUVARING) 0.12-0.015 MG/24HR vaginal ring Place 1 each vaginally every 28 (twenty-eight) days. Insert vaginally and leave in place for 3 consecutive weeks, then remove for 1 week.   Yes [provider]  phenazopyridine (PYRIDIUM) 200 MG tablet Take 1 tablet (200 mg total) by mouth 3 (three) times daily. 09/13/18   Peyton Najjar, MD  sulfamethoxazole-trimethoprim (BACTRIM DS) 800-160 MG tablet Take 1 tablet by mouth 2 (two) times daily. 09/13/18   Peyton Najjar, MD    Family History Family History  Problem Relation Age of Onset  . Alcohol abuse Father   . Alcohol abuse Maternal Aunt   . Diabetes Maternal Aunt   . Alcohol abuse Maternal Uncle   . Alcohol abuse Paternal Aunt   . Cancer Maternal Grandmother        skin  . Alcohol abuse Maternal Uncle   .  Alcohol abuse Maternal Uncle   . Alcohol abuse Maternal Uncle     Social History Social History   Tobacco Use  . Smoking status: Current Every Day Smoker    Packs/day: 0.50    Types: Cigarettes  . Smokeless tobacco: Never Used  Substance Use Topics  . Alcohol use: Yes    Alcohol/week: 0.0 standard drinks    Comment: occassionally  . Drug use: Not Currently     Allergies   Patient has no known allergies.   Review of Systems Review of Systems Unremarkable  Physical Exam Triage Vital Signs ED Triage Vitals  Enc Vitals Group     BP 09/13/18 1742 130/80     Pulse Rate 09/13/18 1742 87     Resp 09/13/18 1742 18     Temp 09/13/18 1742 98.2 F (36.8 C)     Temp Source 09/13/18 1742 Oral     SpO2 09/13/18 1742 100 %     Weight --      Height --      Head Circumference --      Peak Flow --      Pain Score 09/13/18 1743 0     Pain Loc --      Pain Edu? --  Excl. in GC? --    No data found.  Updated Vital Signs BP 130/80 (BP Location: Right Arm)   Pulse 87   Temp 98.2 F (36.8 C) (Oral)   Resp 18   LMP 08/27/2018   SpO2 100%   Visual Acuity Right Eye Distance:   Left Eye Distance:   Bilateral Distance:    Right Eye Near:   Left Eye Near:    Bilateral Near:     Physical Exam No acute distress.  No CVA tenderness.  Abdomen soft without masses or tenderness (except for the full bladder which makes it hard for me to palpate well)  UC Treatments / Results  Labs (all labs ordered are listed, but only abnormal results are displayed) Labs Reviewed  POCT URINALYSIS DIP (MANUAL ENTRY) - Abnormal; Notable for the following components:      Result Value   Blood, UA trace-intact (*)    Leukocytes, UA Trace (*)    All other components within normal limits  URINE CULTURE    EKG None  Radiology No results found.  Procedures Procedures (including critical care time)  Medications Ordered in UC Medications - No data to display  Initial Impression /  Assessment and Plan / UC Course  I have reviewed the triage vital signs and the nursing notes.  Pertinent labs & imaging results that were available during my care of the patient were reviewed by me and considered in my medical decision making (see chart for details).   Consistent with acute cystitis, will treat and culture.  Final Clinical Impressions(s) / UC Diagnoses   Final diagnoses:  Cystitis     Discharge Instructions     Drink plenty of fluids to keep yourself urinating frequently to flush out the bladder.  Take the sulfamethoxazole 1 twice daily for infection  Take the phenazopyridine (Pyridium) 1 pill 3 times daily as needed for urinary pain.  This medicine simply numbs up the bladder to relieve your symptoms, but does not cure anything.  It will make your urine a dark orange color which can stain clothing.  Usually after a couple of doses of this it is not needed because the antibiotic has started working by then.  If you get worse with pain or fever or other symptoms please get rechecked.    ED Prescriptions    Medication Sig Dispense Auth. Provider   sulfamethoxazole-trimethoprim (BACTRIM DS) 800-160 MG tablet Take 1 tablet by mouth 2 (two) times daily. 10 tablet Peyton NajjarHopper, David H, MD   phenazopyridine (PYRIDIUM) 200 MG tablet Take 1 tablet (200 mg total) by mouth 3 (three) times daily. 6 tablet Peyton NajjarHopper, David H, MD     Controlled Substance Prescriptions [place] Controlled Substance Registry consulted? No   Peyton NajjarHopper, David H, MD 09/13/18 [phone]

## 2018-09-13 NOTE — Discharge Instructions (Signed)
Drink plenty of fluids to keep yourself urinating frequently to flush out the bladder.  Take the sulfamethoxazole 1 twice daily for infection  Take the phenazopyridine (Pyridium) 1 pill 3 times daily as needed for urinary pain.  This medicine simply numbs up the bladder to relieve your symptoms, but does not cure anything.  It will make your urine a dark orange color which can stain clothing.  Usually after a couple of doses of this it is not needed because the antibiotic has started working by then.  If you get worse with pain or fever or other symptoms please get rechecked.

## 2018-09-15 ENCOUNTER — Telehealth: Payer: Self-pay | Admitting: Emergency Medicine

## 2018-09-15 LAB — URINE CULTURE
MICRO NUMBER:: 417136
SPECIMEN QUALITY:: ADEQUATE

## 2018-09-15 NOTE — Telephone Encounter (Signed)
Message left on voice mail inquiring about patient's status and encouraging patient to call with questions/concerns.Informed her that culture revealed appropriate rx.

## 2018-12-24 DIAGNOSIS — F411 Generalized anxiety disorder: Secondary | ICD-10-CM | POA: Diagnosis not present

## 2018-12-24 DIAGNOSIS — Z309 Encounter for contraceptive management, unspecified: Secondary | ICD-10-CM | POA: Diagnosis not present

## 2018-12-24 DIAGNOSIS — E559 Vitamin D deficiency, unspecified: Secondary | ICD-10-CM | POA: Diagnosis not present

## 2018-12-24 DIAGNOSIS — G44229 Chronic tension-type headache, not intractable: Secondary | ICD-10-CM | POA: Diagnosis not present

## 2019-02-20 ENCOUNTER — Other Ambulatory Visit: Payer: Self-pay

## 2019-02-20 ENCOUNTER — Emergency Department (INDEPENDENT_AMBULATORY_CARE_PROVIDER_SITE_OTHER)
Admission: EM | Admit: 2019-02-20 | Discharge: 2019-02-20 | Disposition: A | Discharge status: Home / Self Care | Payer: BC Managed Care – PPO | Source: Home / Self Care

## 2019-02-20 DIAGNOSIS — B070 Plantar wart: Secondary | ICD-10-CM

## 2019-02-20 HISTORY — DX: Generalized anxiety disorder: F41.1

## 2019-02-20 NOTE — ED Provider Notes (Signed)
[facility]    CSN: [id number] Arrival date & time: 02/20/19  1337      History   Chief Complaint Chief Complaint  Patient presents with  . Foot Pain    HPI Tammy Werner is a 38 y.o. female.   Is a [place] urgent [facility] established patient.  Pt was [place] the beach a [place] and now has a place on the side of right foot.  Has tried wart remover medication.       Past Medical History:  Diagnosis Date  . Generalized anxiety disorder   . GERD (gastroesophageal reflux disease)     Patient Active Problem List   Diagnosis Date Noted  . 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 Tobacco abuse 04/22/2014    Past Surgical History:  Procedure Laterality Date  . APPENDECTOMY    . TONSILLECTOMY    . WISDOM TOOTH EXTRACTION      OB History   No obstetric history on file.      Home Medications    Prior to Admission medications   Medication Sig Start Date End Date Taking? Authorizing Provider  escitalopram (LEXAPRO) 10 MG tablet Take 10 mg by mouth daily.    [provider]  etonogestrel-ethinyl estradiol (NUVARING) 0.12-0.015 MG/24HR vaginal ring Place 1 each vaginally every 28 (twenty-eight) days. Insert vaginally and leave in place for 3 consecutive weeks, then remove for 1 week.    [provider]  phenazopyridine (PYRIDIUM) 200 MG tablet Take 1 tablet (200 mg total) by mouth 3 (three) times daily. 09/13/18   Peyton Najjar, MD  sulfamethoxazole-trimethoprim (BACTRIM DS) 800-160 MG tablet Take 1 tablet by mouth 2 (two) times daily. 09/13/18   Peyton Najjar, MD    Family History Family History  Problem Relation Age of Onset  . Alcohol abuse Father   . Alcohol abuse Maternal Aunt   . Diabetes Maternal Aunt   . Alcohol abuse Maternal Uncle   . Alcohol abuse Paternal Aunt   . Cancer Maternal Grandmother        skin  . Alcohol abuse Maternal Uncle   . Alcohol abuse Maternal Uncle   . Alcohol abuse Maternal Uncle     Social History  Social History   Tobacco Use  . Smoking status: Current Every Day Smoker    Packs/day: 0.50    Types: Cigarettes  . Smokeless tobacco: Never Used  Substance Use Topics  . Alcohol use: Yes    Alcohol/week: 0.0 standard drinks    Comment: occassionally  . Drug use: Not Currently     Allergies   Patient has no known allergies.   Review of Systems Review of Systems  Musculoskeletal: Positive for gait problem.  All other systems reviewed and are negative.    Physical Exam Triage Vital Signs ED Triage Vitals  Enc Vitals Group     BP 02/20/19 1348 (!) 151/96     Pulse Rate 02/20/19 1348 (!) 101     Resp 02/20/19 1348 20     Temp 02/20/19 1348 98.3 F (36.8 C)     Temp Source 02/20/19 1348 Oral     SpO2 02/20/19 1348 98 %     Weight 02/20/19 1349 169 lb (76.7 kg)     Height 02/20/19 1349 5\' 4"  (1.626 m)     Head Circumference --      Peak Flow --      Pain Score 02/20/19 1349 5     Pain Loc --  Pain Edu? --      Excl. in [place]? --    No data found.  Updated Vital Signs BP (!) 151/96 (BP Location: Right Arm)   Pulse (!) 101   Temp 98.3 F (36.8 C) (Oral)   Resp 20   Ht 5\' 4"  (1.626 m)   Wt 76.7 kg   LMP 02/13/2019   SpO2 98%   BMI 29.01 kg/m    Physical Exam Vitals signs and nursing note reviewed.  Constitutional:      Appearance: Normal appearance. She is normal weight.  HENT:     Head: Normocephalic.  Eyes:     Conjunctiva/sclera: Conjunctivae normal.  Neck:     Musculoskeletal: Normal range of motion and neck supple.  Pulmonary:     Effort: Pulmonary effort is normal.  Musculoskeletal: Normal range of motion.  Skin:    General: Skin is warm and dry.     Comments: 2 cm area of thickened skin with some blistering on lateral aspect of 5th metatarsal head.  Neurological:     General: No focal deficit present.     Mental Status: She is alert.  Psychiatric:        Mood and Affect: Mood normal.      UC Treatments / Results  Labs (all labs  ordered are listed, but only abnormal results are displayed) Labs Reviewed - No data to display  EKG   Radiology No results found.  Procedures Procedures (including critical care time)  Medications Ordered in UC Medications - No data to display  Initial Impression / Assessment and Plan / UC Course  I have reviewed the triage vital signs and the nursing notes.  Pertinent labs & imaging results that were available during my care of the patient were reviewed by me and considered in my medical decision making (see chart for details).    Final Clinical Impressions(s) / UC Diagnoses   Final diagnoses:  Plantar wart of right foot     Discharge Instructions     Usually, over the counter Cimetidine after supper, 150 or 300 mg, daily will eliminate the virus that causes warts.    ED Prescriptions    None     I have reviewed the PDMP during this encounter.   Robyn Haber, MD 02/20/19 1429

## 2019-02-20 NOTE — ED Triage Notes (Signed)
Pt was at the beach a [place] ago and now has a place on the side of right foot.  Has tried wart remover medication.

## 2019-02-20 NOTE — Discharge Instructions (Addendum)
Usually, over the counter Cimetidine after supper, 150 or 300 mg, daily will eliminate the virus that causes warts.

## 2019-03-25 DIAGNOSIS — F411 Generalized anxiety disorder: Secondary | ICD-10-CM | POA: Diagnosis not present

## 2019-03-25 DIAGNOSIS — R635 Abnormal weight gain: Secondary | ICD-10-CM | POA: Diagnosis not present

## 2019-03-25 DIAGNOSIS — Z23 Encounter for immunization: Secondary | ICD-10-CM | POA: Diagnosis not present

## 2019-03-25 DIAGNOSIS — E559 Vitamin D deficiency, unspecified: Secondary | ICD-10-CM | POA: Diagnosis not present

## 2019-03-25 DIAGNOSIS — G44229 Chronic tension-type headache, not intractable: Secondary | ICD-10-CM | POA: Diagnosis not present

## 2019-03-25 DIAGNOSIS — Z6828 Body mass index (BMI) 28.0-28.9, adult: Secondary | ICD-10-CM | POA: Diagnosis not present

## 2019-06-24 DIAGNOSIS — E559 Vitamin D deficiency, unspecified: Secondary | ICD-10-CM | POA: Diagnosis not present

## 2019-06-24 DIAGNOSIS — G44229 Chronic tension-type headache, not intractable: Secondary | ICD-10-CM | POA: Diagnosis not present

## 2019-06-24 DIAGNOSIS — Z131 Encounter for screening for diabetes mellitus: Secondary | ICD-10-CM | POA: Diagnosis not present

## 2019-06-24 DIAGNOSIS — Z79899 Other long term (current) drug therapy: Secondary | ICD-10-CM | POA: Diagnosis not present

## 2019-06-24 DIAGNOSIS — Z13220 Encounter for screening for lipoid disorders: Secondary | ICD-10-CM | POA: Diagnosis not present

## 2019-06-24 DIAGNOSIS — Z0000 Encounter for general adult medical examination without abnormal findings: Secondary | ICD-10-CM | POA: Diagnosis not present

## 2019-06-24 DIAGNOSIS — F411 Generalized anxiety disorder: Secondary | ICD-10-CM | POA: Diagnosis not present

## 2019-08-07 ENCOUNTER — Other Ambulatory Visit: Payer: Self-pay

## 2019-08-07 ENCOUNTER — Emergency Department (INDEPENDENT_AMBULATORY_CARE_PROVIDER_SITE_OTHER)
Admission: EM | Admit: 2019-08-07 | Discharge: 2019-08-07 | Disposition: A | Discharge status: Home / Self Care | Payer: BC Managed Care – PPO | Source: Home / Self Care | Attending: Family Medicine | Admitting: Family Medicine

## 2019-08-07 DIAGNOSIS — R197 Diarrhea, unspecified: Secondary | ICD-10-CM

## 2019-08-07 DIAGNOSIS — R112 Nausea with vomiting, unspecified: Secondary | ICD-10-CM

## 2019-08-07 LAB — POCT URINALYSIS DIP (MANUAL ENTRY)
Bilirubin, UA: NEGATIVE
Blood, UA: NEGATIVE
Glucose, UA: NEGATIVE mg/dL
Ketones, POC UA: NEGATIVE mg/dL
Leukocytes, UA: NEGATIVE
Nitrite, UA: NEGATIVE
Protein Ur, POC: NEGATIVE mg/dL
Spec Grav, UA: 1.02 (ref 1.010–1.025)
Urobilinogen, UA: 0.2 E.U./dL
pH, UA: 7 (ref 5.0–8.0)

## 2019-08-07 LAB — POCT URINE PREGNANCY: Preg Test, Ur: NEGATIVE

## 2019-08-07 MED ORDER — ONDANSETRON 4 MG PO TBDP: ORAL_TABLET | ORAL | 0 refills | Status: AC

## 2019-08-07 MED ORDER — ONDANSETRON 4 MG PO TBDP
4.0000 mg | ORAL_TABLET | Freq: Once | ORAL | Status: AC
  Administered 2019-08-07: 12:00:00 4 mg via ORAL

## 2019-08-07 NOTE — Discharge Instructions (Addendum)
°  Begin Pedialye for about 12 to 18 hours until diarrhea stops, then switch to clear liquids (apple juice, clear grape juice, Jello, etc) for about 12 to 18 hours.  When improved, advance to a BRAT diet (Bananas, Rice, Applesauce, Toast). Then gradually resume a regular diet when tolerated.  Avoid milk products until well.  When stools become more formed, may take Imodium (loperamide) once or twice daily to decrease stool frequency.  °If symptoms become significantly worse during the night or over the weekend, proceed to the local emergency room. ° ° °

## 2019-08-07 NOTE — ED Provider Notes (Signed)
[facility]    CSN: [id number] Arrival date & time: 08/07/19  1112      History   Chief Complaint Chief Complaint  Patient presents with  . Abdominal Pain    HPI Tammy Werner is a 39 y.o. female.   Patient states that her menses started four days ago, and the next day she developed nausea/vomiting, headache, and fatigue.  The vomiting resolved, but nausea persisted followed by abdominal cramps/bloating and watery diarrhea.  She denies fever but has felt hot and cold at times.  She had a negative pregnancy test several days ago.  She denies recent foreign travel, or drinking untreated water in a wilderness environment.  She denies recent antibiotic use.   The history is provided by the patient.    Past Medical History:  Diagnosis Date  . Generalized anxiety disorder   . GERD (gastroesophageal reflux disease)     Patient Active Problem List   Diagnosis Date Noted  . 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 Tobacco abuse 04/22/2014    Past Surgical History:  Procedure Laterality Date  . APPENDECTOMY    . TONSILLECTOMY    . WISDOM TOOTH EXTRACTION      OB History   No obstetric history on file.      Home Medications    Prior to Admission medications   Medication Sig Start Date End Date Taking? Authorizing Provider  escitalopram (LEXAPRO) 10 MG tablet Take 10 mg by mouth daily.    [provider]  etonogestrel-ethinyl estradiol (NUVARING) 0.12-0.015 MG/24HR vaginal ring Place 1 each vaginally every 28 (twenty-eight) days. Insert vaginally and leave in place for 3 consecutive weeks, then remove for 1 week.    [provider]  ondansetron (ZOFRAN ODT) 4 MG disintegrating tablet Take one tab by mouth Q6hr prn nausea.  Dissolve under tongue. 08/07/19   Lattie Haw, MD  phenazopyridine (PYRIDIUM) 200 MG tablet Take 1 tablet (200 mg total) by mouth 3 (three) times daily. 09/13/18   Peyton Najjar, MD  sulfamethoxazole-trimethoprim (BACTRIM  DS) 800-160 MG tablet Take 1 tablet by mouth 2 (two) times daily. 09/13/18   Peyton Najjar, MD    Family History Family History  Problem Relation Age of Onset  . Alcohol abuse Father   . Alcohol abuse Maternal Aunt   . Diabetes Maternal Aunt   . Alcohol abuse Maternal Uncle   . Alcohol abuse Paternal Aunt   . Cancer Maternal Grandmother        skin  . Alcohol abuse Maternal Uncle   . Alcohol abuse Maternal Uncle   . Alcohol abuse Maternal Uncle     Social History Social History   Tobacco Use  . Smoking status: Current Every Day Smoker    Packs/day: 0.50    Types: Cigarettes  . Smokeless tobacco: Never Used  . Tobacco comment: down to 2-3 per day, trying to quit again  Substance Use Topics  . Alcohol use: Yes    Alcohol/week: 0.0 standard drinks    Comment: occassionally  . Drug use: Not Currently     Allergies   Patient has no known allergies.   Review of Systems Review of Systems No sore throat No cough No pleuritic pain No wheezing No nasal congestion No post-nasal drainage No sinus pain/pressure No itchy/red eyes No earache No hemoptysis No SOB No fever/chills but has felt hot + nausea + vomiting + abdominal pain + diarrhea No urinary symptoms No skin rash + fatigue No  myalgias + headache   Physical Exam Triage Vital Signs ED Triage Vitals  Enc Vitals Group     BP 08/07/19 1140 (!) 133/96     Pulse Rate 08/07/19 1140 92     Resp 08/07/19 1140 16     Temp 08/07/19 1140 98.9 F (37.2 C)     Temp Source 08/07/19 1140 Oral     SpO2 08/07/19 1140 99 %     Weight --      Height --      Head Circumference --      Peak Flow --      Pain Score 08/07/19 1137 0     Pain Loc --      Pain Edu? --      Excl. in [place]? --    No data found.  Updated Vital Signs BP (!) 133/96 (BP Location: Right Arm)   Pulse 92   Temp 98.9 F (37.2 C) (Oral)   Resp 16   SpO2 99%   Visual Acuity Right Eye Distance:   Left Eye Distance:   Bilateral  Distance:    Right Eye Near:   Left Eye Near:    Bilateral Near:     Physical Exam Nursing notes and Vital Signs reviewed. Appearance:  Patient appears stated age, and in no acute distress.    Eyes:  Pupils are equal, round, and reactive to light and accomodation.  Extraocular movement is intact.  Conjunctivae are not inflamed   Pharynx:  Normal; moist mucous membranes  Neck:  Supple.  No adenopathy Lungs:  Clear to auscultation.  Breath sounds are equal.  Moving air well. Heart:  Regular rate and rhythm without murmurs, rubs, or gallops.  Abdomen:  Nontender without masses or hepatosplenomegaly.  Bowel sounds are present and increased.  No CVA or flank tenderness.  Extremities:  No edema.  Skin:  No rash present.     UC Treatments / Results  Labs (all labs ordered are listed, but only abnormal results are displayed) Labs Reviewed  POCT URINALYSIS DIP (MANUAL ENTRY) negative  POCT URINE PREGNANCY    EKG   Radiology No results found.  Procedures Procedures (including critical care time)  Medications Ordered in UC Medications  ondansetron (ZOFRAN-ODT) disintegrating tablet 4 mg (4 mg Oral Given 08/07/19 1147)    Initial Impression / Assessment and Plan / UC Course  I have reviewed the triage vital signs and the nursing notes.  Pertinent labs & imaging results that were available during my care of the patient were reviewed by me and considered in my medical decision making (see chart for details).    Benign exam. Suspect viral gastroenteritis. Administered Zofran ODT 4mg  PO; given Rx for same. Followup with Family Doctor if not improved in about 5 days.   Final Clinical Impressions(s) / UC Diagnoses   Final diagnoses:  Nausea vomiting and diarrhea     Discharge Instructions     Begin Pedialye for about 12 to 18 hours until diarrhea stops, then switch to clear liquids (apple juice, clear grape juice, Jello, etc) for about 12 to 18 hours.  When improved, advance  to a (Bananas, Rice, Applesauce, Toast). Then gradually resume a regular diet when tolerated.  Avoid milk products until well.  When stools become more formed, may take Imodium (loperamide) once or twice daily to decrease stool frequency.  If symptoms become significantly worse during the night or over the weekend, proceed to the local emergency room.  ED Prescriptions    Medication Sig Dispense Auth. Provider   ondansetron (ZOFRAN ODT) 4 MG disintegrating tablet Take one tab by mouth Q6hr prn nausea.  Dissolve under tongue. 12 tablet Kandra Nicolas, MD        Kandra Nicolas, MD 08/08/19 1239

## 2019-08-07 NOTE — ED Triage Notes (Signed)
Patient presents to Urgent Care with complaints of abdominal craps and intermittent vomiting since two days ago. Patient reports she has been having frequent BMs, states she is on her menstrual. Pt is having some bowel incontinence as well, states yesterday it was "leaking out". Pt has been taking BC powders for her discomfort, states her period symptoms do not typically last this long. Pt did take a pregnancy test a few days ago and it was negative.

## 2019-08-13 ENCOUNTER — Other Ambulatory Visit: Payer: Self-pay

## 2019-08-13 ENCOUNTER — Emergency Department (INDEPENDENT_AMBULATORY_CARE_PROVIDER_SITE_OTHER): Payer: BC Managed Care – PPO

## 2019-08-13 ENCOUNTER — Emergency Department (INDEPENDENT_AMBULATORY_CARE_PROVIDER_SITE_OTHER)
Admission: EM | Admit: 2019-08-13 | Discharge: 2019-08-13 | Disposition: A | Discharge status: Home / Self Care | Payer: BC Managed Care – PPO | Source: Home / Self Care | Attending: Family Medicine | Admitting: Family Medicine

## 2019-08-13 DIAGNOSIS — R197 Diarrhea, unspecified: Secondary | ICD-10-CM

## 2019-08-13 DIAGNOSIS — K5900 Constipation, unspecified: Secondary | ICD-10-CM

## 2019-08-13 DIAGNOSIS — R1030 Lower abdominal pain, unspecified: Secondary | ICD-10-CM

## 2019-08-13 LAB — POCT CBC W AUTO DIFF (K'VILLE URGENT CARE)

## 2019-08-13 NOTE — Discharge Instructions (Addendum)
Make sure you get at least 8-10 cups of fluid each day, or enough to keep your urine clear or pale yellow. Eat bland foods and gradually reintroduce healthy, nutrient-rich foods as tolerated.

## 2019-08-13 NOTE — ED Triage Notes (Signed)
Pt presents with adb pain and diarrhea which occurs only after eating. Was seen in UC 3/17 for nausea and vomiting but has now turned into abd pain and diarrhea. No one else in household is sick with sxs. Pt states shes lost 8 lbs since last Sunday. Not taking any OTC meds for sxs.

## 2019-08-13 NOTE — ED Notes (Addendum)
Pt to have renal study tomorrow 9am. Per Dr Cathren Harsh, does not need to be seen. Call with results. Also will be returning with a stool sample for GI Panel either tonight or tomorrow.

## 2019-08-13 NOTE — ED Provider Notes (Signed)
[facility]    CSN: [id number] Arrival date & time: 08/13/19  1423      History   Chief Complaint Chief Complaint  Patient presents with   Abdominal Pain   Diarrhea    HPI Tammy Werner is a 39 y.o. female.   Patient was seen six days ago for symptoms suggestive of a viral gastroenteritis.  She returns today reporting that she continues to have diarrhea and abdominal bloating/pain about three hours after eating.  She normally has about two bowel movements each day, but during the past week she has had only three.  Her nausea has improved; she denies vomiting.  Her last menstrual period ended 10 days ago.  She denies fevers, chills, and sweats, and no urinary symptoms.  She notes that her symptoms are sometimes worse after eating Ramen noodles and bread.  The history is provided by the patient.    Past Medical History:  Diagnosis Date   Generalized anxiety disorder    GERD (gastroesophageal reflux disease)     Patient Active Problem List   Diagnosis Date Noted   Dysplasia of cervix 05/20/2014   Tobacco abuse 04/22/2014    Past Surgical History:  Procedure Laterality Date   APPENDECTOMY     TONSILLECTOMY     WISDOM TOOTH EXTRACTION      OB History   No obstetric history on file.      Home Medications    Prior to Admission medications   Medication Sig Start Date End Date Taking? Authorizing Provider  escitalopram (LEXAPRO) 10 MG tablet Take 10 mg by mouth daily.    [provider]  etonogestrel-ethinyl estradiol (NUVARING) 0.12-0.015 MG/24HR vaginal ring Place 1 each vaginally every 28 (twenty-eight) days. Insert vaginally and leave in place for 3 consecutive weeks, then remove for 1 week.    [provider]  ondansetron (ZOFRAN ODT) 4 MG disintegrating tablet Take one tab by mouth Q6hr prn nausea.  Dissolve under tongue. 08/07/19   Lattie Haw, MD  phenazopyridine (PYRIDIUM) 200 MG tablet Take 1 tablet (200 mg total)  by mouth 3 (three) times daily. 09/13/18   Peyton Najjar, MD  sulfamethoxazole-trimethoprim (BACTRIM DS) 800-160 MG tablet Take 1 tablet by mouth 2 (two) times daily. 09/13/18   Peyton Najjar, MD    Family History Family History  Problem Relation Age of Onset   Alcohol abuse Father    Alcohol abuse Maternal Aunt    Diabetes Maternal Aunt    Alcohol abuse Maternal Uncle    Alcohol abuse Paternal Aunt    Cancer Maternal Grandmother        skin   Alcohol abuse Maternal Uncle    Alcohol abuse Maternal Uncle    Alcohol abuse Maternal Uncle     Social History Social History   Tobacco Use   Smoking status: Current Every Day Smoker    Packs/day: 0.50    Types: Cigarettes   Smokeless tobacco: Never Used   Tobacco comment: down to 2-3 per day, trying to quit again  Substance Use Topics   Alcohol use: Yes    Alcohol/week: 0.0 standard drinks    Comment: occassionally   Drug use: Not Currently     Allergies   Patient has no known allergies.   Review of Systems Review of Systems  Constitutional: Positive for activity change, appetite change and unexpected weight change. Negative for chills, diaphoresis, fatigue and fever.  HENT: Negative.   Eyes: Negative.   Respiratory: Negative.  Cardiovascular: Negative.   Gastrointestinal: Positive for abdominal distention, abdominal pain, constipation, diarrhea and nausea. Negative for anal bleeding, blood in stool and vomiting.  Genitourinary: Negative.   Musculoskeletal: Negative.   Skin: Negative.   Neurological: Negative for headaches.     Physical Exam Triage Vital Signs ED Triage Vitals [08/13/19 1440]  Enc Vitals Group     BP 121/81     Pulse Rate 98     Resp 16     Temp 98.4 F (36.9 C)     Temp Source Oral     SpO2 99 %     Weight 163 lb (73.9 kg)     Height 5\' 4"  (1.626 m)     Head Circumference      Peak Flow      Pain Score 0     Pain Loc      Pain Edu?      Excl. in GC?    No data  found.  Updated Vital Signs BP 121/81 (BP Location: Right Arm)    Pulse 98    Temp 98.4 F (36.9 C) (Oral)    Resp 16    Ht 5\' 4"  (1.626 m)    Wt 73.9 kg    LMP  (LMP Unknown)    SpO2 99%    BMI 27.98 kg/m   Visual Acuity Right Eye Distance:   Left Eye Distance:   Bilateral Distance:    Right Eye Near:   Left Eye Near:    Bilateral Near:     Physical Exam Nursing notes and Vital Signs reviewed. Appearance:  Patient appears stated age, and in no acute distress.    Eyes:  Pupils are equal, round, and reactive to light and accomodation.  Extraocular movement is intact.  Conjunctivae are not inflamed   Pharynx:  Normal; moist mucous membranes  Neck:  Supple.  No adenopathy Lungs:  Clear to auscultation.  Breath sounds are equal.  Moving air well. Heart:  Regular rate and rhythm without murmurs, rubs, or gallops.  Abdomen:  No masses or hepatosplenomegaly.  She has diffuse mild tenderness.  No rebound.  Bowel sounds are quiet.  No CVA or flank tenderness.  Extremities:  No edema.  Skin:  No rash present.      UC Treatments / Results  Labs (all labs ordered are listed, but only abnormal results are displayed) Labs Reviewed  GI PATHOGEN PANEL BY PCR, STOOL  COMPLETE METABOLIC PANEL WITH GFR  SEDIMENTATION RATE  TSH  POCT CBC W AUTO DIFF ([facility])   WBC 9.1; LY 34.8; MO 9.3; GR 55.9; Hgb 15.4; Platelets 273       EKG   Radiology DG Abdomen 1 View  Addendum Date: 08/13/2019   ADDENDUM REPORT: 08/13/2019 16:25 ADDENDUM: Upon further evaluation, a 2 mm soft tissue calcification is seen projecting over the right upper quadrant. This overlies the expected region of the mid to upper right kidney. It should be noted that a minimal amount of stool is seen throughout the colon. Results were discussed with Dr. 08/15/2019 at 4:23 p.m. 08/15/2019 on August 13, 2019. Electronically Signed   By: [place] M.D.   On: 08/13/2019 16:25   Result Date: 08/13/2019 CLINICAL DATA:   Abdominal pain and bloating with intermittent diarrhea and constipation. EXAM: ABDOMEN - 1 VIEW COMPARISON:  None. FINDINGS: The bowel gas pattern is normal. No radio-opaque calculi or other significant radiographic abnormality are seen. Subcentimeter phleboliths are seen within the lower left  hemipelvis. IMPRESSION: Negative. Electronically Signed: By: Virgina Norfolk M.D. On: 08/13/2019 15:56    Procedures Procedures (including critical care time)  Medications Ordered in UC Medications - No data to display  Initial Impression / Assessment and Plan / UC Course  I have reviewed the triage vital signs and the nursing notes.  Pertinent labs & imaging results that were available during my care of the patient were reviewed by me and considered in my medical decision making (see chart for details).    Normal CBC reassuring.  CMP, sed rate, TSH, and GI pathogen panel pending. KUB study unremarkable except for finding of 3mm soft tissue calcification projecting over the right upper quadrant; therefore will schedule CT renal study tomorrow (doubt urological etiology) Patient may ultimately need GI referral for evaluation (consider inflammatory bowel disease, celiac disease, etc.).   Final Clinical Impressions(s) / UC Diagnoses   Final diagnoses:  Diarrhea, unspecified type  Lower abdominal pain  Constipation, unspecified constipation type     Discharge Instructions      Make sure you get at least 8-10 cups of fluid each day, or enough to keep your urine clear or pale yellow.  Eat bland foods and gradually reintroduce healthy, nutrient-rich foods as tolerated.   ED Prescriptions    None        Kandra Nicolas, MD 08/14/19 [phone]

## 2019-08-14 ENCOUNTER — Telehealth: Payer: Self-pay | Admitting: Emergency Medicine

## 2019-08-14 ENCOUNTER — Other Ambulatory Visit: Payer: BC Managed Care – PPO

## 2019-08-14 ENCOUNTER — Emergency Department
Admission: EM | Admit: 2019-08-14 | Discharge: 2019-08-14 | Disposition: A | Discharge status: Home / Self Care | Payer: BC Managed Care – PPO | Source: Home / Self Care

## 2019-08-14 ENCOUNTER — Emergency Department (INDEPENDENT_AMBULATORY_CARE_PROVIDER_SITE_OTHER): Payer: BC Managed Care – PPO

## 2019-08-14 DIAGNOSIS — R1030 Lower abdominal pain, unspecified: Secondary | ICD-10-CM

## 2019-08-14 DIAGNOSIS — N200 Calculus of kidney: Secondary | ICD-10-CM | POA: Diagnosis not present

## 2019-08-14 LAB — TSH: TSH: 2.41 mIU/L

## 2019-08-14 LAB — COMPLETE METABOLIC PANEL WITH GFR
AG Ratio: 1.5 (calc) (ref 1.0–2.5)
ALT: 14 U/L (ref 6–29)
AST: 13 U/L (ref 10–30)
Albumin: 4.6 g/dL (ref 3.6–5.1)
Alkaline phosphatase (APISO): 64 U/L (ref 31–125)
BUN: 13 mg/dL (ref 7–25)
CO2: 26 mmol/L (ref 20–32)
Calcium: 9.9 mg/dL (ref 8.6–10.2)
Chloride: 104 mmol/L (ref 98–110)
Creat: 1.06 mg/dL (ref 0.50–1.10)
GFR, Est African American: 78 mL/min/{1.73_m2} (ref >=60)
GFR, Est Non African American: 67 mL/min/{1.73_m2} (ref >=60)
Globulin: 3.1 g/dL (calc) (ref 1.9–3.7)
Glucose, Bld: 90 mg/dL (ref 65–99)
Potassium: 4.3 mmol/L (ref 3.5–5.3)
Sodium: 134 mmol/L — ABNORMAL LOW (ref 135–146)
Total Bilirubin: 0.5 mg/dL (ref 0.2–1.2)
Total Protein: 7.7 g/dL (ref 6.1–8.1)

## 2019-08-14 LAB — SEDIMENTATION RATE: Sed Rate: 2 mm/h (ref 0–20)

## 2019-08-14 LAB — TIQ- AMBIGUOUS ORDER

## 2019-08-14 NOTE — Telephone Encounter (Signed)
Discussed CT results with pt and provided contact info for [facility] and primary care for further evaluation and treatment of ongoing GI symptoms.

## 2019-08-14 NOTE — ED Triage Notes (Signed)
Patient is here for renal study results. Still c/o lower abdominal pain and mild HA and diarrhea.

## 2019-09-12 DIAGNOSIS — G4700 Insomnia, unspecified: Secondary | ICD-10-CM | POA: Diagnosis not present

## 2019-09-12 DIAGNOSIS — F515 Nightmare disorder: Secondary | ICD-10-CM | POA: Diagnosis not present

## 2019-09-12 DIAGNOSIS — R0683 Snoring: Secondary | ICD-10-CM | POA: Diagnosis not present

## 2019-09-12 DIAGNOSIS — G4750 Parasomnia, unspecified: Secondary | ICD-10-CM | POA: Diagnosis not present

## 2019-09-23 DIAGNOSIS — N938 Other specified abnormal uterine and vaginal bleeding: Secondary | ICD-10-CM | POA: Diagnosis not present

## 2019-09-23 DIAGNOSIS — Z3202 Encounter for pregnancy test, result negative: Secondary | ICD-10-CM | POA: Diagnosis not present

## 2019-09-24 DIAGNOSIS — R112 Nausea with vomiting, unspecified: Secondary | ICD-10-CM | POA: Diagnosis not present

## 2019-09-24 DIAGNOSIS — R1084 Generalized abdominal pain: Secondary | ICD-10-CM | POA: Diagnosis not present

## 2019-09-24 DIAGNOSIS — K219 Gastro-esophageal reflux disease without esophagitis: Secondary | ICD-10-CM | POA: Diagnosis not present

## 2019-09-24 DIAGNOSIS — R197 Diarrhea, unspecified: Secondary | ICD-10-CM | POA: Diagnosis not present

## 2019-09-30 DIAGNOSIS — R197 Diarrhea, unspecified: Secondary | ICD-10-CM | POA: Diagnosis not present

## 2019-10-05 DIAGNOSIS — R0683 Snoring: Secondary | ICD-10-CM | POA: Diagnosis not present

## 2019-10-15 DIAGNOSIS — R0683 Snoring: Secondary | ICD-10-CM | POA: Diagnosis not present

## 2019-11-04 DIAGNOSIS — N938 Other specified abnormal uterine and vaginal bleeding: Secondary | ICD-10-CM | POA: Diagnosis not present

## 2019-11-14 DIAGNOSIS — K219 Gastro-esophageal reflux disease without esophagitis: Secondary | ICD-10-CM | POA: Diagnosis not present

## 2019-11-14 DIAGNOSIS — R197 Diarrhea, unspecified: Secondary | ICD-10-CM | POA: Diagnosis not present

## 2019-11-14 DIAGNOSIS — K6389 Other specified diseases of intestine: Secondary | ICD-10-CM | POA: Diagnosis not present

## 2019-11-14 DIAGNOSIS — K3189 Other diseases of stomach and duodenum: Secondary | ICD-10-CM | POA: Diagnosis not present

## 2019-11-14 DIAGNOSIS — K2980 Duodenitis without bleeding: Secondary | ICD-10-CM | POA: Diagnosis not present

## 2019-11-14 DIAGNOSIS — D123 Benign neoplasm of transverse colon: Secondary | ICD-10-CM | POA: Diagnosis not present

## 2019-11-14 DIAGNOSIS — K633 Ulcer of intestine: Secondary | ICD-10-CM | POA: Diagnosis not present

## 2019-11-15 DIAGNOSIS — N938 Other specified abnormal uterine and vaginal bleeding: Secondary | ICD-10-CM | POA: Diagnosis not present

## 2019-11-15 DIAGNOSIS — Z3202 Encounter for pregnancy test, result negative: Secondary | ICD-10-CM | POA: Diagnosis not present

## 2019-11-15 DIAGNOSIS — Z3042 Encounter for surveillance of injectable contraceptive: Secondary | ICD-10-CM | POA: Diagnosis not present

## 2019-12-19 DIAGNOSIS — E663 Overweight: Secondary | ICD-10-CM | POA: Diagnosis not present

## 2019-12-19 DIAGNOSIS — R0683 Snoring: Secondary | ICD-10-CM | POA: Diagnosis not present

## 2019-12-19 DIAGNOSIS — G4750 Parasomnia, unspecified: Secondary | ICD-10-CM | POA: Diagnosis not present

## 2019-12-19 DIAGNOSIS — F419 Anxiety disorder, unspecified: Secondary | ICD-10-CM | POA: Diagnosis not present

## 2020-01-22 DIAGNOSIS — Z309 Encounter for contraceptive management, unspecified: Secondary | ICD-10-CM | POA: Diagnosis not present

## 2020-01-22 DIAGNOSIS — K2900 Acute gastritis without bleeding: Secondary | ICD-10-CM | POA: Diagnosis not present

## 2020-01-22 DIAGNOSIS — G44229 Chronic tension-type headache, not intractable: Secondary | ICD-10-CM | POA: Diagnosis not present

## 2020-01-22 DIAGNOSIS — G4750 Parasomnia, unspecified: Secondary | ICD-10-CM | POA: Diagnosis not present

## 2020-01-24 DIAGNOSIS — R0683 Snoring: Secondary | ICD-10-CM | POA: Diagnosis not present

## 2020-01-27 DIAGNOSIS — R0683 Snoring: Secondary | ICD-10-CM | POA: Diagnosis not present

## 2020-02-14 DIAGNOSIS — Z3042 Encounter for surveillance of injectable contraceptive: Secondary | ICD-10-CM | POA: Diagnosis not present

## 2020-03-04 DIAGNOSIS — F411 Generalized anxiety disorder: Secondary | ICD-10-CM | POA: Diagnosis not present

## 2020-03-04 DIAGNOSIS — G44229 Chronic tension-type headache, not intractable: Secondary | ICD-10-CM | POA: Diagnosis not present

## 2020-03-04 DIAGNOSIS — K2900 Acute gastritis without bleeding: Secondary | ICD-10-CM | POA: Diagnosis not present

## 2020-03-04 DIAGNOSIS — Z309 Encounter for contraceptive management, unspecified: Secondary | ICD-10-CM | POA: Diagnosis not present

## 2020-03-19 DIAGNOSIS — G4750 Parasomnia, unspecified: Secondary | ICD-10-CM | POA: Diagnosis not present

## 2020-03-19 DIAGNOSIS — R0683 Snoring: Secondary | ICD-10-CM | POA: Diagnosis not present

## 2020-03-25 ENCOUNTER — Other Ambulatory Visit: Payer: Self-pay

## 2020-03-25 ENCOUNTER — Ambulatory Visit (INDEPENDENT_AMBULATORY_CARE_PROVIDER_SITE_OTHER): Payer: BC Managed Care – PPO | Admitting: Sports Medicine

## 2020-03-25 ENCOUNTER — Ambulatory Visit (INDEPENDENT_AMBULATORY_CARE_PROVIDER_SITE_OTHER): Payer: BC Managed Care – PPO

## 2020-03-25 DIAGNOSIS — M25561 Pain in right knee: Secondary | ICD-10-CM | POA: Diagnosis not present

## 2020-03-25 DIAGNOSIS — M79604 Pain in right leg: Secondary | ICD-10-CM | POA: Diagnosis not present

## 2020-03-25 DIAGNOSIS — M84361A Stress fracture, right tibia, initial encounter for fracture: Secondary | ICD-10-CM | POA: Insufficient documentation

## 2020-03-25 MED ORDER — PREDNISONE 50 MG PO TABS: ORAL_TABLET | ORAL | 0 refills | Status: DC

## 2020-03-25 MED ORDER — PREGABALIN 75 MG PO CAPS: 75.0000 mg | ORAL_CAPSULE | Freq: Two times a day (BID) | ORAL | 3 refills | Status: DC

## 2020-03-25 NOTE — Progress Notes (Signed)
    Procedures performed today:    None.  Independent interpretation of notes and tests performed by another provider:   None.  Brief History, Exam, Impression, and Recommendations:    Right leg pain This is a very pleasant 39 year old female, she recalls an episode years ago of having a Baker's cyst that resulted in nerve compression and dropfoot. She was given an AFO, the cyst was drained and symptoms improved considerably. Ultimately she still had some pain and was on Lyrica for some time.  She does not recall the dose of Lyrica. Then when pregnancy came around she had to stop the Lyrica. More recently she went on a long walk, the next day she had severe pain along her tibialis anterior muscle belly, as well as her knee, no foot drop, no paresthesias. On exam she has tenderness at the tibialis anterior muscle belly, her knee exam is for the most part unremarkable and I am unable to palpate a Baker's cyst. She is neurovascularly intact distally without foot drop. I do suspect that the current episode is delayed onset muscle soreness, we will add 5 days of prednisone, she will take it easy, and I am going to add some Lyrica for her to take twice a day, we may ultimately stop this if her pain gets significantly better. If she has persistence of discomfort we will proceed with advanced imaging in the form of an MRI and potentially nerve conduction/EMG. Return to see me in a month.    ___________________________________________ Ihor Austin. Benjamin Stain, M.D., ABFM., CAQSM. Primary Care and Sports Medicine [facility]  Adjunct Instructor of Family Medicine  [employer]

## 2020-03-25 NOTE — Assessment & Plan Note (Signed)
This is a very pleasant 39 year old female, she recalls an episode years ago of having a Baker's cyst that resulted in nerve compression and dropfoot. She was given an AFO, the cyst was drained and symptoms improved considerably. Ultimately she still had some pain and was on Lyrica for some time.  She does not recall the dose of Lyrica. Then when pregnancy came around she had to stop the Lyrica. More recently she went on a long walk, the next day she had severe pain along her tibialis anterior muscle belly, as well as her knee, no foot drop, no paresthesias. On exam she has tenderness at the tibialis anterior muscle belly, her knee exam is for the most part unremarkable and I am unable to palpate a Baker's cyst. She is neurovascularly intact distally without foot drop. I do suspect that the current episode is delayed onset muscle soreness, we will add 5 days of prednisone, she will take it easy, and I am going to add some Lyrica for her to take twice a day, we may ultimately stop this if her pain gets significantly better. If she has persistence of discomfort we will proceed with advanced imaging in the form of an MRI and potentially nerve conduction/EMG. Return to see me in a month.

## 2020-03-30 ENCOUNTER — Other Ambulatory Visit: Payer: Self-pay | Admitting: Sports Medicine

## 2020-04-22 ENCOUNTER — Ambulatory Visit (INDEPENDENT_AMBULATORY_CARE_PROVIDER_SITE_OTHER): Payer: BC Managed Care – PPO | Admitting: Sports Medicine

## 2020-04-22 ENCOUNTER — Other Ambulatory Visit: Payer: Self-pay

## 2020-04-22 DIAGNOSIS — M79604 Pain in right leg: Secondary | ICD-10-CM | POA: Diagnosis not present

## 2020-04-22 DIAGNOSIS — M84361A Stress fracture, right tibia, initial encounter for fracture: Secondary | ICD-10-CM

## 2020-04-22 MED ORDER — PREGABALIN 100 MG PO CAPS: 100.0000 mg | ORAL_CAPSULE | Freq: Two times a day (BID) | ORAL | 3 refills | Status: AC

## 2020-04-22 NOTE — Assessment & Plan Note (Addendum)
This is a very pleasant 39 year old female, we have been treating her for right-sided lower leg pain, she had a long walk, and then the next day she had severe pain along the tibialis anterior. Exam was otherwise unremarkable. She is neurovascularly intact at the time without foot drop. We added prednisone, Lyrica 75 mg twice a day, she improved considerably but unfortunately her pain relief has plateaued, she still has significant discomfort over the anterior lower leg and the tibialis anterior though she does have good strength and good sensation. Knee exam is unremarkable. At this point due to persistence of symptoms we are going to proceed with an MRI of the right tib/fib, and I am going to increase her Lyrica to 100 mg twice daily, she understands that we can ultimately go up to a total of 300 mg twice daily if needed. Certainly if all of the above is unrevealing we will investigate her for exertional compartment syndrome.  Noted tibial stress fracture, weightbearing as tolerated with a stirrup Aircast brace.  If she does not have 1 we can provide her with 1.  No running or exercising for now.

## 2020-04-22 NOTE — Progress Notes (Addendum)
    Procedures performed today:    None.  Independent interpretation of notes and tests performed by another provider:   None.  Brief History, Exam, Impression, and Recommendations:    Stress fracture of right tibia This is a very pleasant 39 year old female, we have been treating her for right-sided lower leg pain, she had a long walk, and then the next day she had severe pain along the tibialis anterior. Exam was otherwise unremarkable. She is neurovascularly intact at the time without foot drop. We added prednisone, Lyrica 75 mg twice a day, she improved considerably but unfortunately her pain relief has plateaued, she still has significant discomfort over the anterior lower leg and the tibialis anterior though she does have good strength and good sensation. Knee exam is unremarkable. At this point due to persistence of symptoms we are going to proceed with an MRI of the right tib/fib, and I am going to increase her Lyrica to 100 mg twice daily, she understands that we can ultimately go up to a total of 300 mg twice daily if needed. Certainly if all of the above is unrevealing we will investigate her for exertional compartment syndrome.  Noted tibial stress fracture, weightbearing as tolerated with a stirrup Aircast brace.  If she does not have 1 we can provide her with 1.  No running or exercising for now.    ___________________________________________ Ihor Austin. Benjamin Stain, M.D., ABFM., CAQSM. Primary Care and Sports Medicine [facility]  Adjunct Instructor of Family Medicine  [employer]

## 2020-04-27 ENCOUNTER — Ambulatory Visit (INDEPENDENT_AMBULATORY_CARE_PROVIDER_SITE_OTHER): Payer: BC Managed Care – PPO

## 2020-04-27 ENCOUNTER — Other Ambulatory Visit: Payer: Self-pay

## 2020-04-27 DIAGNOSIS — M79661 Pain in right lower leg: Secondary | ICD-10-CM | POA: Diagnosis not present

## 2020-04-27 DIAGNOSIS — M79604 Pain in right leg: Secondary | ICD-10-CM

## 2020-04-27 DIAGNOSIS — R600 Localized edema: Secondary | ICD-10-CM | POA: Diagnosis not present

## 2020-05-08 DIAGNOSIS — Z3042 Encounter for surveillance of injectable contraceptive: Secondary | ICD-10-CM | POA: Diagnosis not present

## 2020-05-13 ENCOUNTER — Other Ambulatory Visit: Payer: Self-pay

## 2020-05-13 ENCOUNTER — Emergency Department
Admission: EM | Admit: 2020-05-13 | Discharge: 2020-05-13 | Disposition: A | Discharge status: Home / Self Care | Payer: BC Managed Care – PPO | Source: Home / Self Care

## 2020-05-13 DIAGNOSIS — J029 Acute pharyngitis, unspecified: Secondary | ICD-10-CM

## 2020-05-13 DIAGNOSIS — R059 Cough, unspecified: Secondary | ICD-10-CM

## 2020-05-13 DIAGNOSIS — J069 Acute upper respiratory infection, unspecified: Secondary | ICD-10-CM

## 2020-05-13 MED ORDER — BENZONATATE 100 MG PO CAPS: 100.0000 mg | ORAL_CAPSULE | Freq: Three times a day (TID) | ORAL | 0 refills | Status: AC | PRN

## 2020-05-13 MED ORDER — AMOXICILLIN 875 MG PO TABS: 875.0000 mg | ORAL_TABLET | Freq: Two times a day (BID) | ORAL | 0 refills | Status: AC

## 2020-05-13 NOTE — ED Provider Notes (Addendum)
[facility]    CSN: [id number] Arrival date & time: 05/13/20  4268      History   Chief Complaint Chief Complaint  Patient presents with  . Sore Throat  . Cough    HPI Tammy Werner is a 39 y.o. female.   Established [facility] patient  Pt presents for persistent cough, sore throat, headache, and bloody sputum for past 6 days. She reports taking 2nd dose of moderna and flu vaccine last Thursday 12/16 all at the same time. OTC mucinex and rx'd migraine medication without any improvement. Of note, pt's child's daycare have cases of strep.  Patient has retained her sense of smell.  Had bloody nose from right nostril yesterday.  Low grade temp of 99.8  Productive cough of yellow phlegm when awakening..  Patient is a smoker.  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     Past Medical History:  Diagnosis Date  . Generalized anxiety disorder   . GERD (gastroesophageal reflux disease)     Patient Active Problem List   Diagnosis Date Noted  . Stress fracture of right tibia 03/25/2020  . 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 Tobacco abuse 04/22/2014    Past Surgical History:  Procedure Laterality Date  . APPENDECTOMY    . TONSILLECTOMY    . WISDOM TOOTH EXTRACTION      OB History   No obstetric history on file.      Home Medications    Prior to Admission medications   Medication Sig Start Date End Date Taking? Authorizing Provider  amoxicillin (AMOXIL) 875 MG tablet Take 1 tablet (875 mg total) by mouth 2 (two) times daily. Take with food 05/13/20   Elvina Sidle, MD  benzonatate (TESSALON) 100 MG capsule Take 1-2 capsules (100-200 mg total) by mouth 3 (three) times daily as needed for cough. 05/13/20   Elvina Sidle, MD  butalbital-acetaminophen-caffeine (FIORICET) [phone] MG tablet Take 1 tablet by mouth daily as needed. 05/04/20   [provider]  escitalopram (LEXAPRO) 10 MG tablet Take 10 mg by mouth daily.    [provider]  ondansetron (ZOFRAN ODT) 4 MG disintegrating tablet Take one tab by mouth Q6hr prn nausea.  Dissolve under tongue. 08/07/19   Lattie Haw, MD  pregabalin (LYRICA) 100 MG capsule Take 1 capsule (100 mg total) by mouth 2 (two) times daily. 04/22/20   Monica Becton, MD    Family History Family History  Problem Relation Age of Onset  . Alcohol abuse Father   . Alcohol abuse Maternal Aunt   . Diabetes Maternal Aunt   . Alcohol abuse Maternal Uncle   . Alcohol abuse Paternal Aunt   . Cancer Maternal Grandmother        skin  . Alcohol abuse Maternal Uncle   . Alcohol abuse Maternal Uncle   . Alcohol abuse Maternal Uncle   . Schizophrenia Mother     Social History Social History   Tobacco Use  . Smoking status: Current Every Day Smoker    Packs/day: 0.25    Types: Cigarettes  . Smokeless tobacco: Never Used  . Tobacco comment: down to 2-3 per day, trying to quit again  Vaping Use  . Vaping Use: Never used  Substance Use Topics  . Alcohol use: Yes    Alcohol/week: 0.0 standard drinks    Comment: occassionally  . Drug use: Not Currently     Allergies   Patient has no known allergies.   Review of  Systems Review of Systems  Constitutional: Positive for fatigue and fever.  HENT: Positive for sore throat.   Respiratory: Positive for cough.   Skin: Negative.   Neurological: Positive for headaches.      Physical Exam Triage Vital Signs ED Triage Vitals [05/13/20 0848]  Enc Vitals Group     BP      Pulse      Resp      Temp      Temp src      SpO2      Weight 163 lb (73.9 kg)     Height 5\' 4"  (1.626 m)     Head Circumference      Peak Flow      Pain Score 4     Pain Loc      Pain Edu?      Excl. in GC?    No data found.  Updated Vital Signs BP 136/86   Pulse 86   Temp 98.6 F (37 C) (Oral)   Resp 19   Ht 5\' 4"  (1.626 m)   Wt 73.9 kg   SpO2 98%   BMI 27.98 kg/m    Physical Exam Vitals and nursing note reviewed.  Constitutional:       General: She is not in acute distress.    Appearance: She is well-developed and normal weight.  HENT:     Head: Normocephalic.     Right Ear: Tympanic membrane normal.     Left Ear: Tympanic membrane normal.     Nose: Congestion present.     Comments: Dried blood right nostril    Mouth/Throat:     Mouth: Mucous membranes are moist.     Pharynx: Posterior oropharyngeal erythema present. No oropharyngeal exudate.  Eyes:     Conjunctiva/sclera: Conjunctivae normal.  Cardiovascular:     Rate and Rhythm: Normal rate.     Heart sounds: Normal heart sounds.  Pulmonary:     Effort: Pulmonary effort is normal.     Comments: Few faint rales mid chest on inspiration, no wheezes. Musculoskeletal:     Cervical back: Normal range of motion and neck supple.  Skin:    General: Skin is warm and dry.  Neurological:     General: No focal deficit present.     Mental Status: She is alert.  Psychiatric:        Mood and Affect: Mood normal.      UC Treatments / Results  Labs (all labs ordered are listed, but only abnormal results are displayed) Labs Reviewed  STREP A DNA PROBE  SARS-COV-2 RNA,(COVID-19) QUALITATIVE NAAT  POCT RAPID STREP A (OFFICE)     Radiology No results found.  Procedures Procedures (including critical care time)  Medications Ordered in UC Medications - No data to display  Initial Impression / Assessment and Plan / UC Course  I have reviewed the triage vital signs and the nursing notes.  Pertinent labs & imaging results that were available during my care of the patient were reviewed by me and considered in my medical decision making (see chart for details).    Final Clinical Impressions(s) / UC Diagnoses   Final diagnoses:  Acute pharyngitis, unspecified etiology  Viral upper respiratory tract infection     Discharge Instructions     We are running a more accurate strep test and at the same time a Covid test.  Results of these to test should be  back in 2 days.    ED Prescriptions  Medication Sig Dispense Auth. Provider   amoxicillin (AMOXIL) 875 MG tablet Take 1 tablet (875 mg total) by mouth 2 (two) times daily. Take with food 20 tablet Elvina Sidle, MD   benzonatate (TESSALON) 100 MG capsule Take 1-2 capsules (100-200 mg total) by mouth 3 (three) times daily as needed for cough. 40 capsule Elvina Sidle, MD     I have reviewed the PDMP during this encounter.   Elvina Sidle, MD 05/13/20 8527    Elvina Sidle, MD 05/13/20 [phone]

## 2020-05-13 NOTE — Discharge Instructions (Addendum)
We are running a more accurate strep test and at the same time a Covid test.  Results of these to test should be back in 2 days.

## 2020-05-13 NOTE — ED Triage Notes (Signed)
Pt presents for persistent cough, sore throat, headache, and bloody sputum. She reports taking 2nd dose of moderna and flu vaccine last Thursday 12/16 all at the same time. OTC mucinex and rx'd migraine medication without any improvement. Of note, pt's child's daycare have cases of strep.

## 2020-05-14 LAB — STREP A DNA PROBE: Group A Strep Probe: NOT DETECTED

## 2020-05-15 LAB — SARS-COV-2 RNA,(COVID-19) QUALITATIVE NAAT: SARS CoV2 RNA: NOT DETECTED

## 2020-05-25 ENCOUNTER — Ambulatory Visit: Payer: BC Managed Care – PPO | Admitting: Sports Medicine

## 2020-05-26 ENCOUNTER — Ambulatory Visit (INDEPENDENT_AMBULATORY_CARE_PROVIDER_SITE_OTHER): Payer: BC Managed Care – PPO | Admitting: Sports Medicine

## 2020-05-26 ENCOUNTER — Other Ambulatory Visit: Payer: Self-pay

## 2020-05-26 DIAGNOSIS — M84361D Stress fracture, right tibia, subsequent encounter for fracture with routine healing: Secondary | ICD-10-CM

## 2020-05-26 NOTE — Progress Notes (Signed)
    Procedures performed today:    None.  Independent interpretation of notes and tests performed by another provider:   None.  Brief History, Exam, Impression, and Recommendations:    Stress fracture of right tibia This is a very pleasant 40 year old female, she had right lower extremity pain, mostly after a long walk. Exam was overall unremarkable.  We had added prednisone, Lyrica, but continued to have discomfort over the anterior lower leg and the tibialis anterior. We proceeded with the MRI of the tibia and fibula that did ultimately show a stress fracture. We upped her Lyrica to 100 mg twice daily and added an Aircast stirrup brace. She has improved considerably over the last month and has essentially 0 pain today, exam is benign, I did advise her that she could wear some arch support L prevent this from happening again, and she can return to see me on an as-needed basis, discontinue the stirrup brace. The current dose of Lyrica is also tremendously efficacious and she has 0 pain in her entire body. We can refill this as needed, return as needed.     ___________________________________________ Ihor Austin. Benjamin Stain, M.D., ABFM., CAQSM. Primary Care and Sports Medicine [facility]  Adjunct Instructor of Family Medicine  [employer]

## 2020-05-26 NOTE — Assessment & Plan Note (Signed)
This is a very pleasant 40 year old female, she had right lower extremity pain, mostly after a long walk. Exam was overall unremarkable.  We had added prednisone, Lyrica, but continued to have discomfort over the anterior lower leg and the tibialis anterior. We proceeded with the MRI of the tibia and fibula that did ultimately show a stress fracture. We upped her Lyrica to 100 mg twice daily and added an Aircast stirrup brace. She has improved considerably over the last month and has essentially 0 pain today, exam is benign, I did advise her that she could wear some arch support L prevent this from happening again, and she can return to see me on an as-needed basis, discontinue the stirrup brace. The current dose of Lyrica is also tremendously efficacious and she has 0 pain in her entire body. We can refill this as needed, return as needed.

## 2020-06-17 ENCOUNTER — Other Ambulatory Visit: Payer: Self-pay | Admitting: Internal Medicine

## 2020-06-17 DIAGNOSIS — E559 Vitamin D deficiency, unspecified: Secondary | ICD-10-CM | POA: Diagnosis not present

## 2020-06-17 DIAGNOSIS — Z13220 Encounter for screening for lipoid disorders: Secondary | ICD-10-CM | POA: Diagnosis not present

## 2020-06-17 DIAGNOSIS — F411 Generalized anxiety disorder: Secondary | ICD-10-CM | POA: Diagnosis not present

## 2020-06-17 DIAGNOSIS — G5791 Unspecified mononeuropathy of right lower limb: Secondary | ICD-10-CM | POA: Diagnosis not present

## 2020-06-17 DIAGNOSIS — Z0000 Encounter for general adult medical examination without abnormal findings: Secondary | ICD-10-CM | POA: Diagnosis not present

## 2020-06-17 DIAGNOSIS — G44229 Chronic tension-type headache, not intractable: Secondary | ICD-10-CM | POA: Diagnosis not present

## 2020-06-17 DIAGNOSIS — Z131 Encounter for screening for diabetes mellitus: Secondary | ICD-10-CM | POA: Diagnosis not present

## 2020-06-18 LAB — CBC
HCT: 45.1 % — ABNORMAL HIGH (ref 35.0–45.0)
Hemoglobin: 15.7 g/dL — ABNORMAL HIGH (ref 11.7–15.5)
MCH: 33 pg (ref 27.0–33.0)
MCHC: 34.8 g/dL (ref 32.0–36.0)
MCV: 94.7 fL (ref 80.0–100.0)
MPV: 10 fL (ref 7.5–12.5)
Platelets: 235 10*3/uL (ref 140–400)
RBC: 4.76 10*6/uL (ref 3.80–5.10)
RDW: 11 % (ref 11.0–15.0)
WBC: 7.1 10*3/uL (ref 3.8–10.8)

## 2020-06-18 LAB — COMPLETE METABOLIC PANEL WITH GFR
AG Ratio: 1.8 (calc) (ref 1.0–2.5)
ALT: 18 U/L (ref 6–29)
AST: 14 U/L (ref 10–30)
Albumin: 4.5 g/dL (ref 3.6–5.1)
Alkaline phosphatase (APISO): 48 U/L (ref 31–125)
BUN: 14 mg/dL (ref 7–25)
CO2: 22 mmol/L (ref 20–32)
Calcium: 9.4 mg/dL (ref 8.6–10.2)
Chloride: 107 mmol/L (ref 98–110)
Creat: 0.88 mg/dL (ref 0.50–1.10)
GFR, Est African American: 97 mL/min/{1.73_m2} (ref >=60)
GFR, Est Non African American: 83 mL/min/{1.73_m2} (ref >=60)
Globulin: 2.5 g/dL (calc) (ref 1.9–3.7)
Glucose, Bld: 86 mg/dL (ref 65–99)
Potassium: 4.4 mmol/L (ref 3.5–5.3)
Sodium: 138 mmol/L (ref 135–146)
Total Bilirubin: 0.5 mg/dL (ref 0.2–1.2)
Total Protein: 7 g/dL (ref 6.1–8.1)

## 2020-06-18 LAB — LIPID PANEL
Cholesterol: 187 mg/dL (ref <200)
HDL: 62 mg/dL (ref >=50)
LDL Cholesterol (Calc): 109 mg/dL (calc) — ABNORMAL HIGH
Non-HDL Cholesterol (Calc): 125 mg/dL (calc) (ref <130)
Total CHOL/HDL Ratio: 3 (calc) (ref <5.0)
Triglycerides: 72 mg/dL (ref <150)

## 2020-06-18 LAB — VITAMIN D 25 HYDROXY (VIT D DEFICIENCY, FRACTURES): Vit D, 25-Hydroxy: 67 ng/mL (ref 30–100)

## 2020-06-18 LAB — TSH: TSH: 1.38 mIU/L

## 2020-08-03 DIAGNOSIS — Z3042 Encounter for surveillance of injectable contraceptive: Secondary | ICD-10-CM | POA: Diagnosis not present

## 2020-08-03 DIAGNOSIS — Z6828 Body mass index (BMI) 28.0-28.9, adult: Secondary | ICD-10-CM | POA: Diagnosis not present

## 2020-08-03 DIAGNOSIS — Z01419 Encounter for gynecological examination (general) (routine) without abnormal findings: Secondary | ICD-10-CM | POA: Diagnosis not present

## 2020-08-05 DIAGNOSIS — Z1151 Encounter for screening for human papillomavirus (HPV): Secondary | ICD-10-CM | POA: Diagnosis not present

## 2020-08-05 DIAGNOSIS — Z124 Encounter for screening for malignant neoplasm of cervix: Secondary | ICD-10-CM | POA: Diagnosis not present

## 2020-08-05 DIAGNOSIS — Z01419 Encounter for gynecological examination (general) (routine) without abnormal findings: Secondary | ICD-10-CM | POA: Diagnosis not present

## 2020-08-21 DIAGNOSIS — R87810 Cervical high risk human papillomavirus (HPV) DNA test positive: Secondary | ICD-10-CM | POA: Diagnosis not present

## 2020-08-21 DIAGNOSIS — R87612 Low grade squamous intraepithelial lesion on cytologic smear of cervix (LGSIL): Secondary | ICD-10-CM | POA: Diagnosis not present

## 2020-08-21 DIAGNOSIS — N870 Mild cervical dysplasia: Secondary | ICD-10-CM | POA: Diagnosis not present

## 2020-10-27 DIAGNOSIS — Z3042 Encounter for surveillance of injectable contraceptive: Secondary | ICD-10-CM | POA: Diagnosis not present

## 2021-01-15 DIAGNOSIS — Z3042 Encounter for surveillance of injectable contraceptive: Secondary | ICD-10-CM | POA: Diagnosis not present

## 2021-01-19 IMAGING — DX DG KNEE 1-2V*L*
4 series · 4 of 4 positions shown · non-contrast
Comparison: None.

CLINICAL DATA: Right knee pain.

EXAM:
RIGHT KNEE - COMPLETE 4+ VIEW; LEFT KNEE - 1-2 VIEW

[tunnel (1 of 2)]
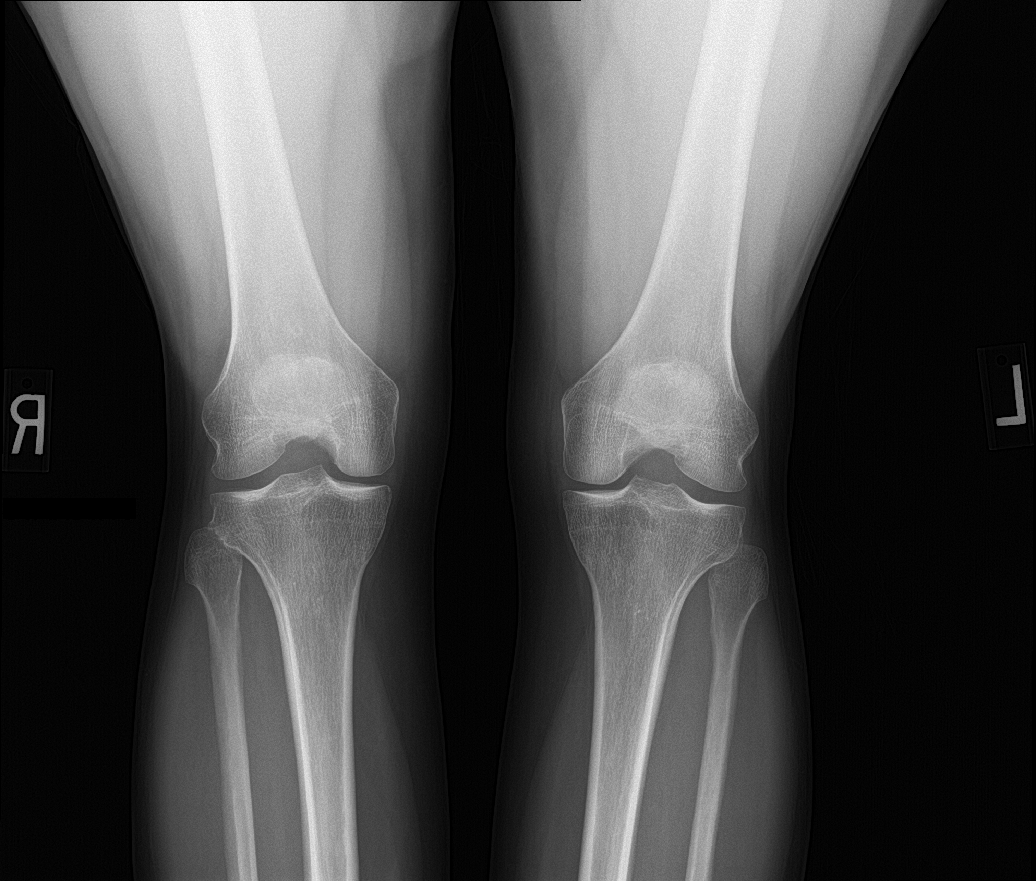

[tunnel (2 of 2)]
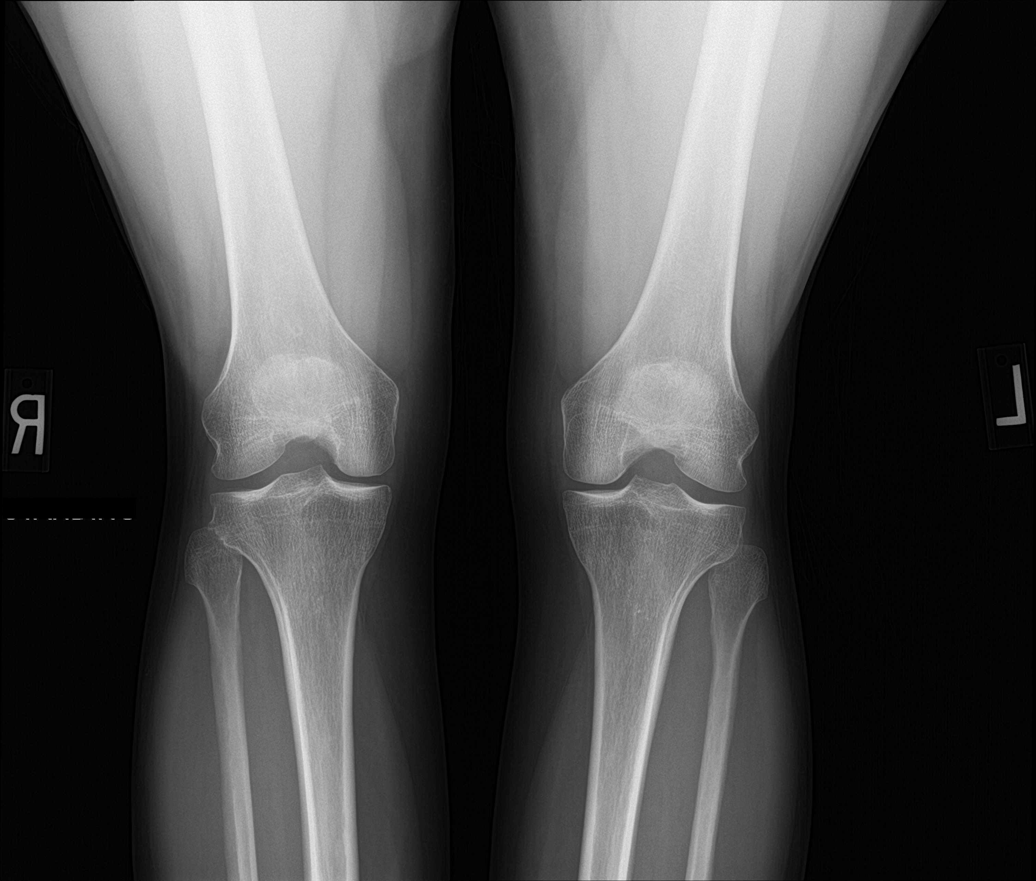

[knee ap bilat standing (1 of 2)]
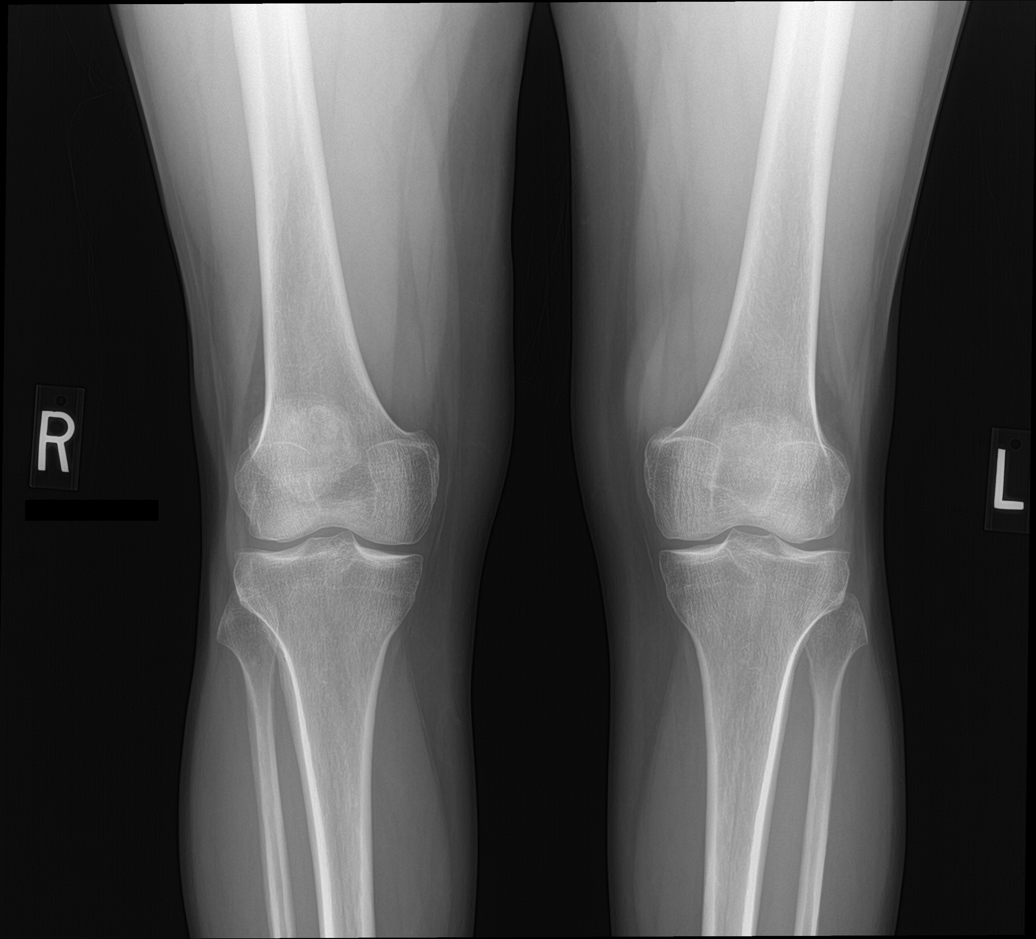

[knee ap bilat standing (2 of 2)]
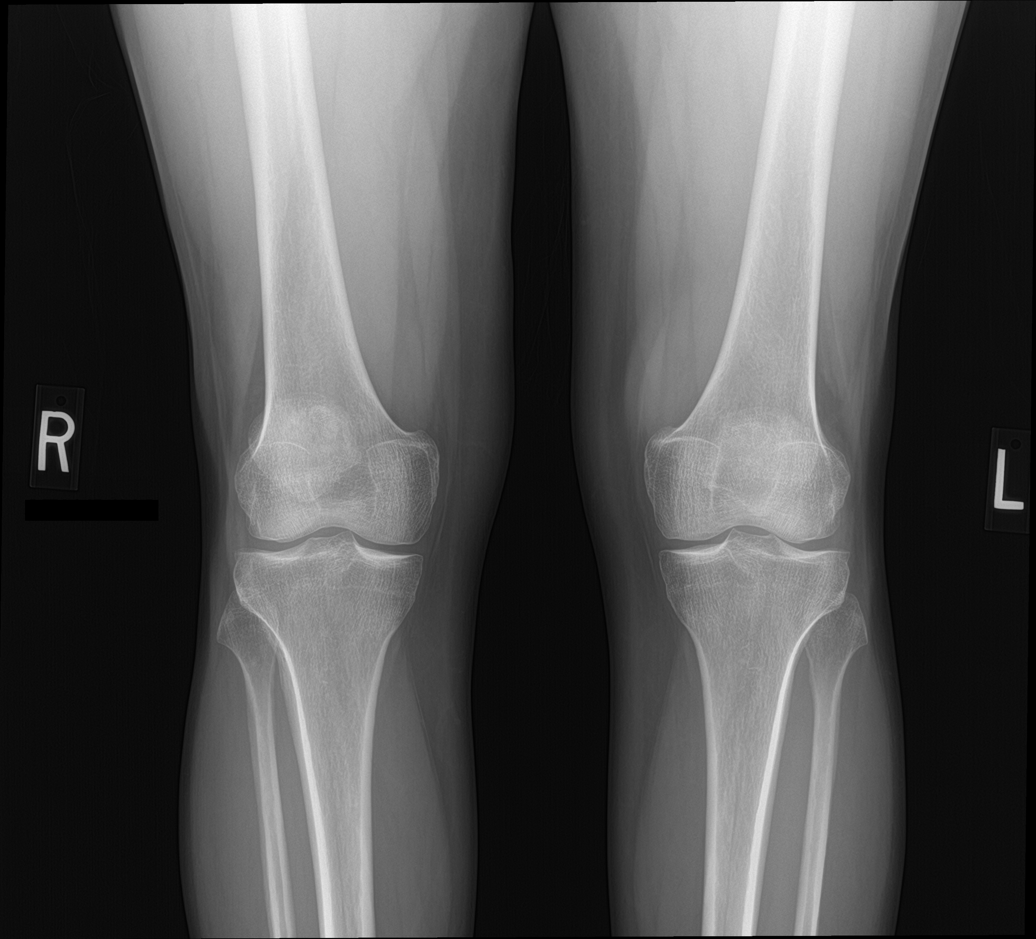

[4 of 4 positions shown; findings below may reference images not displayed]

FINDINGS: Right knee: No acute fracture or dislocation. No joint effusion.
Joint spaces are preserved. Bone mineralization is normal. Soft
tissues are unremarkable.

Left knee: Standing AP and tunnel views demonstrate no acute
fracture or dislocation. Joint spaces are preserved. Bone
mineralization is normal. Soft tissues are unremarkable.
IMPRESSION: Negative.

## 2021-01-19 IMAGING — DX DG KNEE COMPLETE 4+V*R*
4 series · 4 of 4 positions shown · non-contrast
Comparison: None.

CLINICAL DATA: Right knee pain.

EXAM:
RIGHT KNEE - COMPLETE 4+ VIEW; LEFT KNEE - 1-2 VIEW

[tunnel]
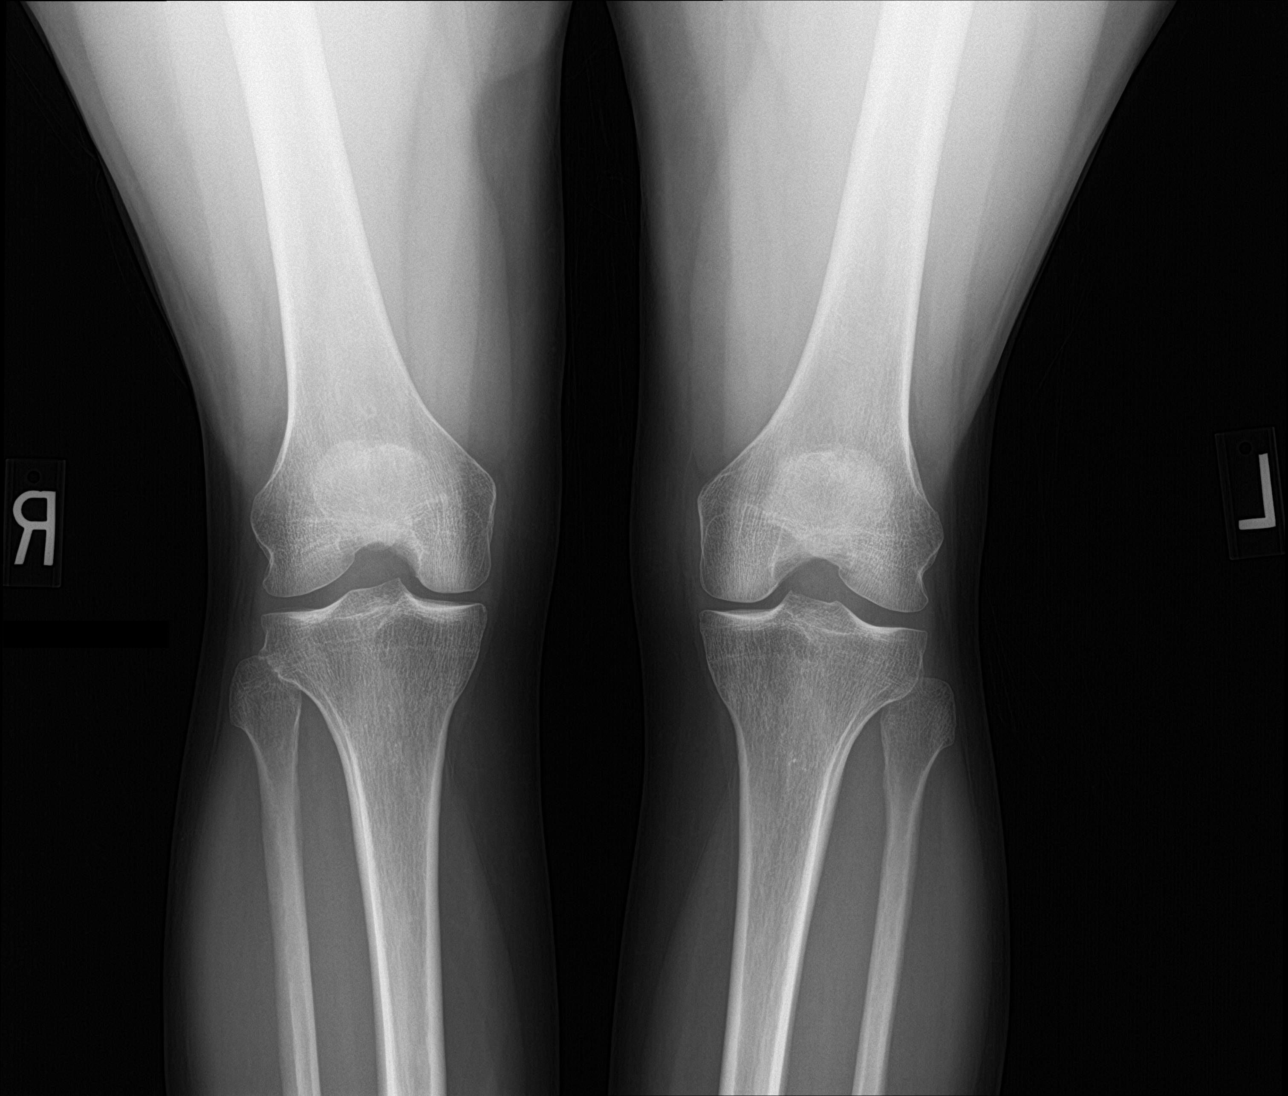

[knee lat]
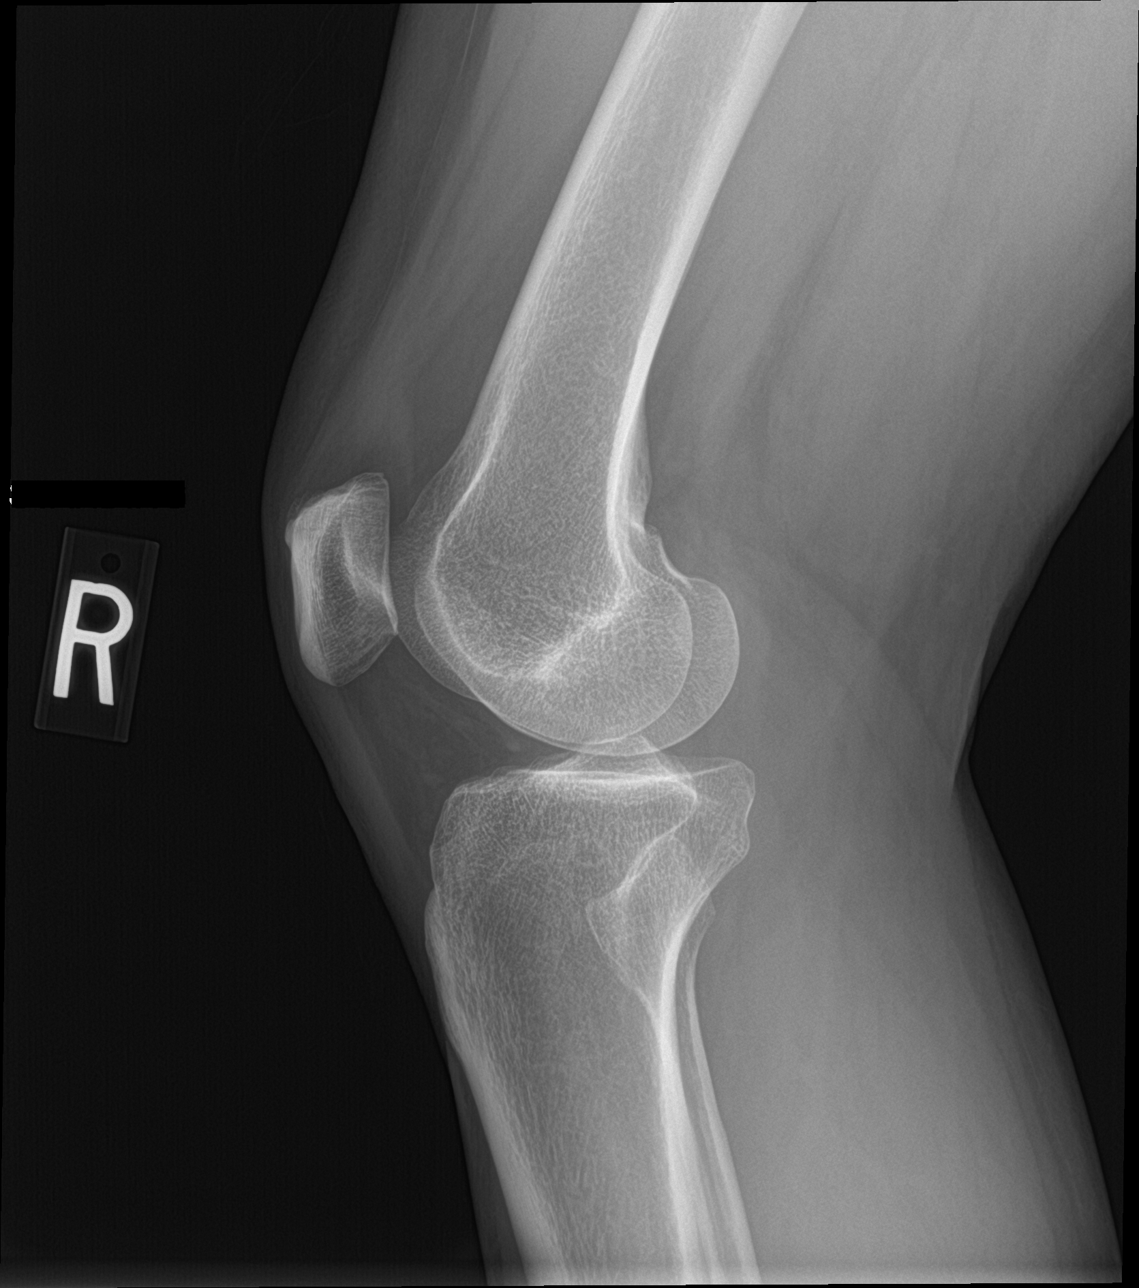

[knee sunrise]
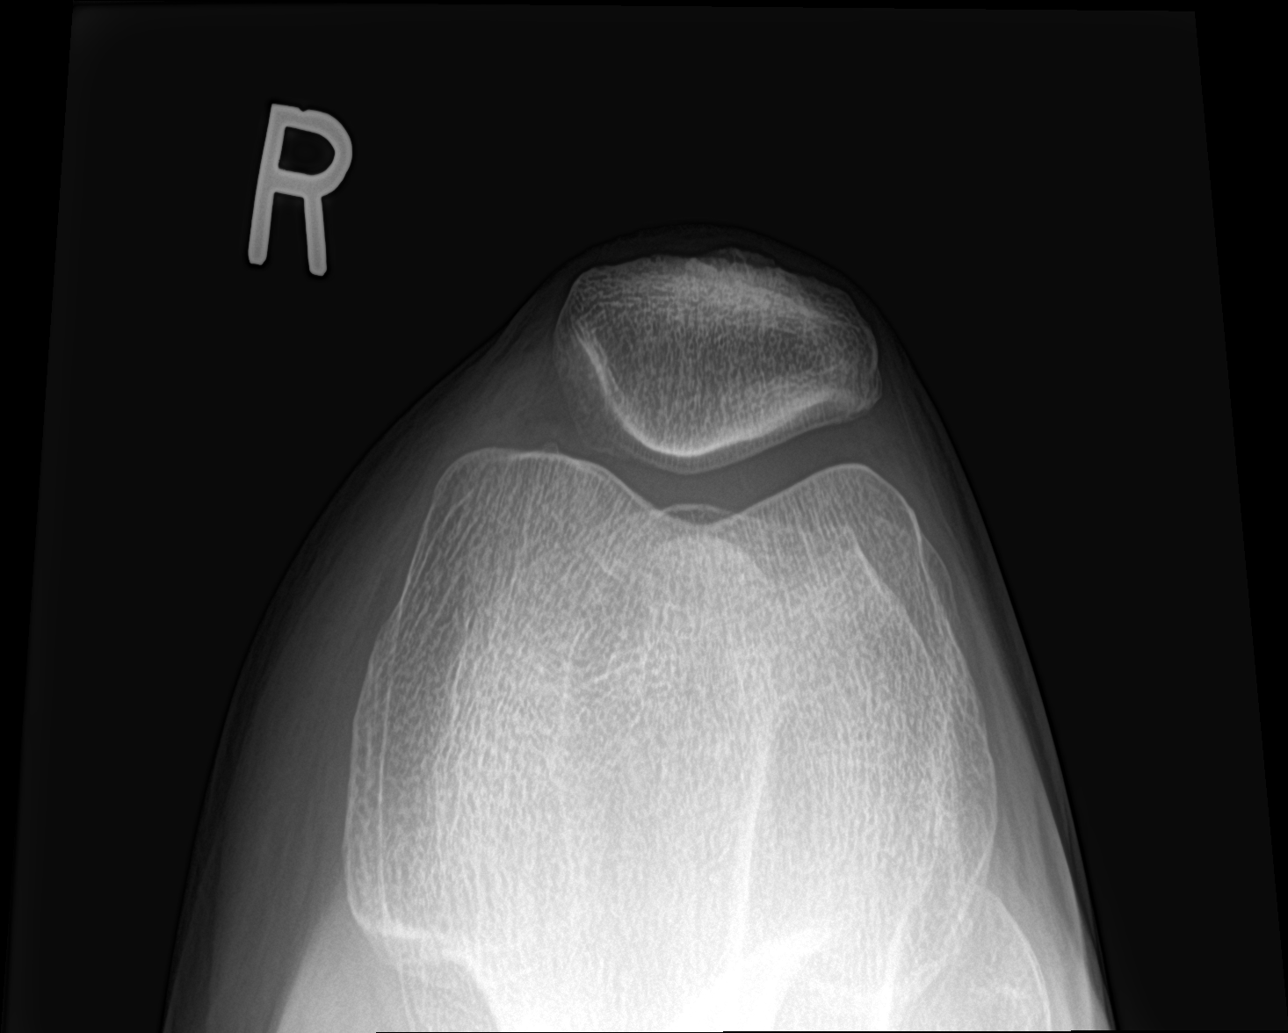

[knee ap bilat standing]
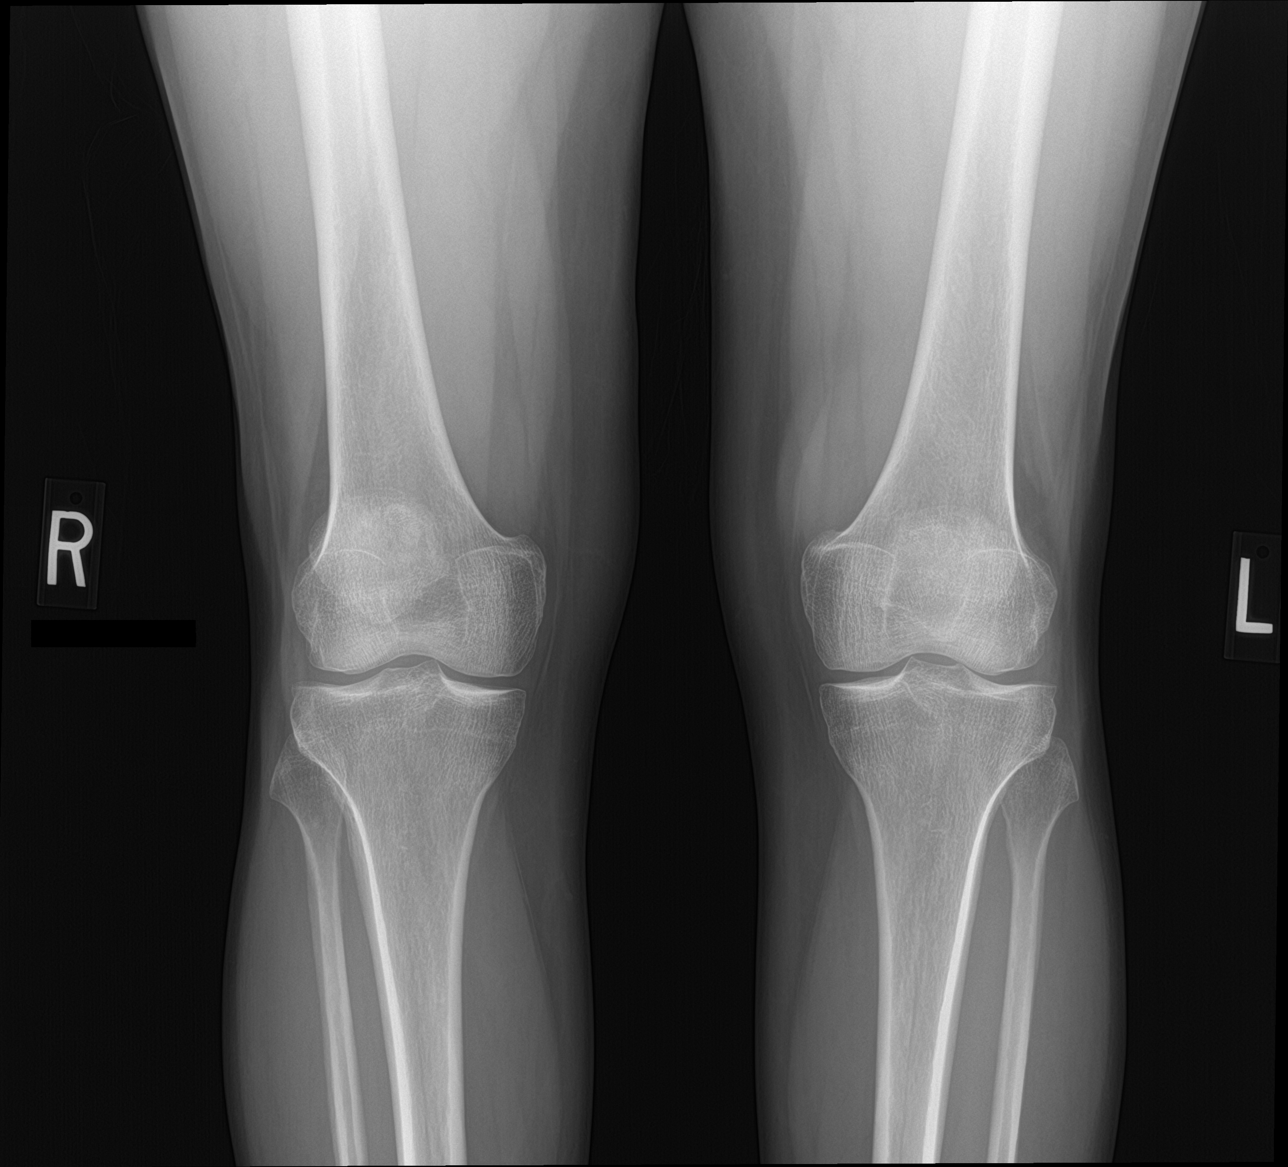

[4 of 4 positions shown; findings below may reference images not displayed]

FINDINGS: Right knee: No acute fracture or dislocation. No joint effusion.
Joint spaces are preserved. Bone mineralization is normal. Soft
tissues are unremarkable.

Left knee: Standing AP and tunnel views demonstrate no acute
fracture or dislocation. Joint spaces are preserved. Bone
mineralization is normal. Soft tissues are unremarkable.
IMPRESSION: Negative.

## 2021-04-09 DIAGNOSIS — Z3042 Encounter for surveillance of injectable contraceptive: Secondary | ICD-10-CM | POA: Diagnosis not present

## 2021-08-13 DIAGNOSIS — Z01419 Encounter for gynecological examination (general) (routine) without abnormal findings: Secondary | ICD-10-CM | POA: Diagnosis not present

## 2021-08-13 DIAGNOSIS — Z6830 Body mass index (BMI) 30.0-30.9, adult: Secondary | ICD-10-CM | POA: Diagnosis not present

## 2021-08-13 DIAGNOSIS — N870 Mild cervical dysplasia: Secondary | ICD-10-CM | POA: Diagnosis not present

## 2021-08-13 DIAGNOSIS — Z0000 Encounter for general adult medical examination without abnormal findings: Secondary | ICD-10-CM | POA: Diagnosis not present

## 2021-08-13 DIAGNOSIS — Z13220 Encounter for screening for lipoid disorders: Secondary | ICD-10-CM | POA: Diagnosis not present

## 2021-09-24 DIAGNOSIS — Z1231 Encounter for screening mammogram for malignant neoplasm of breast: Secondary | ICD-10-CM | POA: Diagnosis not present

## 2022-09-08 DIAGNOSIS — Z01419 Encounter for gynecological examination (general) (routine) without abnormal findings: Secondary | ICD-10-CM | POA: Diagnosis not present

## 2022-09-08 DIAGNOSIS — R87810 Cervical high risk human papillomavirus (HPV) DNA test positive: Secondary | ICD-10-CM | POA: Diagnosis not present

## 2022-09-08 DIAGNOSIS — Z6827 Body mass index (BMI) 27.0-27.9, adult: Secondary | ICD-10-CM | POA: Diagnosis not present

## 2022-09-08 DIAGNOSIS — F418 Other specified anxiety disorders: Secondary | ICD-10-CM | POA: Diagnosis not present

## 2022-09-08 DIAGNOSIS — Z3042 Encounter for surveillance of injectable contraceptive: Secondary | ICD-10-CM | POA: Diagnosis not present

## 2022-09-14 ENCOUNTER — Ambulatory Visit: Payer: BC Managed Care – PPO | Admitting: Family Medicine

## 2022-09-23 DIAGNOSIS — R87618 Other abnormal cytological findings on specimens from cervix uteri: Secondary | ICD-10-CM | POA: Diagnosis not present

## 2022-09-23 DIAGNOSIS — R87810 Cervical high risk human papillomavirus (HPV) DNA test positive: Secondary | ICD-10-CM | POA: Diagnosis not present

## 2022-09-27 DIAGNOSIS — Z1231 Encounter for screening mammogram for malignant neoplasm of breast: Secondary | ICD-10-CM | POA: Diagnosis not present

## 2022-09-28 ENCOUNTER — Ambulatory Visit: Payer: BC Managed Care – PPO | Admitting: Family Medicine

## 2022-09-29 ENCOUNTER — Ambulatory Visit: Payer: BC Managed Care – PPO | Admitting: Family Medicine

## 2022-10-06 DIAGNOSIS — F418 Other specified anxiety disorders: Secondary | ICD-10-CM | POA: Diagnosis not present

## 2022-11-15 DIAGNOSIS — F331 Major depressive disorder, recurrent, moderate: Secondary | ICD-10-CM | POA: Diagnosis not present

## 2022-11-15 DIAGNOSIS — F411 Generalized anxiety disorder: Secondary | ICD-10-CM | POA: Diagnosis not present

## 2022-11-15 DIAGNOSIS — F4312 Post-traumatic stress disorder, chronic: Secondary | ICD-10-CM | POA: Diagnosis not present

## 2022-11-22 DIAGNOSIS — F331 Major depressive disorder, recurrent, moderate: Secondary | ICD-10-CM | POA: Diagnosis not present

## 2022-11-22 DIAGNOSIS — F4312 Post-traumatic stress disorder, chronic: Secondary | ICD-10-CM | POA: Diagnosis not present

## 2022-11-22 DIAGNOSIS — F411 Generalized anxiety disorder: Secondary | ICD-10-CM | POA: Diagnosis not present

## 2022-11-30 DIAGNOSIS — F331 Major depressive disorder, recurrent, moderate: Secondary | ICD-10-CM | POA: Diagnosis not present

## 2022-11-30 DIAGNOSIS — F411 Generalized anxiety disorder: Secondary | ICD-10-CM | POA: Diagnosis not present

## 2022-11-30 DIAGNOSIS — F4312 Post-traumatic stress disorder, chronic: Secondary | ICD-10-CM | POA: Diagnosis not present

## 2022-11-30 DIAGNOSIS — F1210 Cannabis abuse, uncomplicated: Secondary | ICD-10-CM | POA: Diagnosis not present

## 2022-12-01 DIAGNOSIS — Z3042 Encounter for surveillance of injectable contraceptive: Secondary | ICD-10-CM | POA: Diagnosis not present

## 2022-12-07 DIAGNOSIS — F411 Generalized anxiety disorder: Secondary | ICD-10-CM | POA: Diagnosis not present

## 2022-12-07 DIAGNOSIS — F4312 Post-traumatic stress disorder, chronic: Secondary | ICD-10-CM | POA: Diagnosis not present

## 2022-12-07 DIAGNOSIS — F1910 Other psychoactive substance abuse, uncomplicated: Secondary | ICD-10-CM | POA: Diagnosis not present

## 2022-12-07 DIAGNOSIS — F331 Major depressive disorder, recurrent, moderate: Secondary | ICD-10-CM | POA: Diagnosis not present

## 2022-12-12 DIAGNOSIS — F4312 Post-traumatic stress disorder, chronic: Secondary | ICD-10-CM | POA: Diagnosis not present

## 2022-12-12 DIAGNOSIS — F331 Major depressive disorder, recurrent, moderate: Secondary | ICD-10-CM | POA: Diagnosis not present

## 2022-12-12 DIAGNOSIS — F411 Generalized anxiety disorder: Secondary | ICD-10-CM | POA: Diagnosis not present

## 2022-12-14 DIAGNOSIS — F411 Generalized anxiety disorder: Secondary | ICD-10-CM | POA: Diagnosis not present

## 2022-12-14 DIAGNOSIS — F1910 Other psychoactive substance abuse, uncomplicated: Secondary | ICD-10-CM | POA: Diagnosis not present

## 2022-12-14 DIAGNOSIS — F331 Major depressive disorder, recurrent, moderate: Secondary | ICD-10-CM | POA: Diagnosis not present

## 2022-12-14 DIAGNOSIS — F4312 Post-traumatic stress disorder, chronic: Secondary | ICD-10-CM | POA: Diagnosis not present

## 2022-12-21 DIAGNOSIS — F331 Major depressive disorder, recurrent, moderate: Secondary | ICD-10-CM | POA: Diagnosis not present

## 2022-12-21 DIAGNOSIS — F1910 Other psychoactive substance abuse, uncomplicated: Secondary | ICD-10-CM | POA: Diagnosis not present

## 2022-12-21 DIAGNOSIS — F411 Generalized anxiety disorder: Secondary | ICD-10-CM | POA: Diagnosis not present

## 2022-12-21 DIAGNOSIS — F4312 Post-traumatic stress disorder, chronic: Secondary | ICD-10-CM | POA: Diagnosis not present

## 2022-12-28 DIAGNOSIS — F1910 Other psychoactive substance abuse, uncomplicated: Secondary | ICD-10-CM | POA: Diagnosis not present

## 2022-12-28 DIAGNOSIS — F331 Major depressive disorder, recurrent, moderate: Secondary | ICD-10-CM | POA: Diagnosis not present

## 2022-12-28 DIAGNOSIS — F4312 Post-traumatic stress disorder, chronic: Secondary | ICD-10-CM | POA: Diagnosis not present

## 2022-12-28 DIAGNOSIS — F411 Generalized anxiety disorder: Secondary | ICD-10-CM | POA: Diagnosis not present

## 2023-01-11 DIAGNOSIS — F1910 Other psychoactive substance abuse, uncomplicated: Secondary | ICD-10-CM | POA: Diagnosis not present

## 2023-01-11 DIAGNOSIS — F411 Generalized anxiety disorder: Secondary | ICD-10-CM | POA: Diagnosis not present

## 2023-01-11 DIAGNOSIS — F331 Major depressive disorder, recurrent, moderate: Secondary | ICD-10-CM | POA: Diagnosis not present

## 2023-01-11 DIAGNOSIS — F4312 Post-traumatic stress disorder, chronic: Secondary | ICD-10-CM | POA: Diagnosis not present

## 2023-01-25 DIAGNOSIS — F4312 Post-traumatic stress disorder, chronic: Secondary | ICD-10-CM | POA: Diagnosis not present

## 2023-01-25 DIAGNOSIS — F331 Major depressive disorder, recurrent, moderate: Secondary | ICD-10-CM | POA: Diagnosis not present

## 2023-01-25 DIAGNOSIS — F1910 Other psychoactive substance abuse, uncomplicated: Secondary | ICD-10-CM | POA: Diagnosis not present

## 2023-01-25 DIAGNOSIS — F411 Generalized anxiety disorder: Secondary | ICD-10-CM | POA: Diagnosis not present

## 2023-02-01 DIAGNOSIS — F4312 Post-traumatic stress disorder, chronic: Secondary | ICD-10-CM | POA: Diagnosis not present

## 2023-02-01 DIAGNOSIS — F1910 Other psychoactive substance abuse, uncomplicated: Secondary | ICD-10-CM | POA: Diagnosis not present

## 2023-02-01 DIAGNOSIS — F331 Major depressive disorder, recurrent, moderate: Secondary | ICD-10-CM | POA: Diagnosis not present

## 2023-02-01 DIAGNOSIS — F411 Generalized anxiety disorder: Secondary | ICD-10-CM | POA: Diagnosis not present

## 2023-02-08 DIAGNOSIS — F1910 Other psychoactive substance abuse, uncomplicated: Secondary | ICD-10-CM | POA: Diagnosis not present

## 2023-02-08 DIAGNOSIS — F4312 Post-traumatic stress disorder, chronic: Secondary | ICD-10-CM | POA: Diagnosis not present

## 2023-02-08 DIAGNOSIS — F411 Generalized anxiety disorder: Secondary | ICD-10-CM | POA: Diagnosis not present

## 2023-02-08 DIAGNOSIS — F331 Major depressive disorder, recurrent, moderate: Secondary | ICD-10-CM | POA: Diagnosis not present

## 2023-02-15 DIAGNOSIS — F411 Generalized anxiety disorder: Secondary | ICD-10-CM | POA: Diagnosis not present

## 2023-02-15 DIAGNOSIS — F1910 Other psychoactive substance abuse, uncomplicated: Secondary | ICD-10-CM | POA: Diagnosis not present

## 2023-02-15 DIAGNOSIS — F4312 Post-traumatic stress disorder, chronic: Secondary | ICD-10-CM | POA: Diagnosis not present

## 2023-02-15 DIAGNOSIS — F331 Major depressive disorder, recurrent, moderate: Secondary | ICD-10-CM | POA: Diagnosis not present

## 2023-02-21 DIAGNOSIS — Z3042 Encounter for surveillance of injectable contraceptive: Secondary | ICD-10-CM | POA: Diagnosis not present

## 2023-02-22 DIAGNOSIS — F1910 Other psychoactive substance abuse, uncomplicated: Secondary | ICD-10-CM | POA: Diagnosis not present

## 2023-02-22 DIAGNOSIS — F411 Generalized anxiety disorder: Secondary | ICD-10-CM | POA: Diagnosis not present

## 2023-02-22 DIAGNOSIS — F4312 Post-traumatic stress disorder, chronic: Secondary | ICD-10-CM | POA: Diagnosis not present

## 2023-02-22 DIAGNOSIS — F331 Major depressive disorder, recurrent, moderate: Secondary | ICD-10-CM | POA: Diagnosis not present

## 2023-03-08 DIAGNOSIS — F4312 Post-traumatic stress disorder, chronic: Secondary | ICD-10-CM | POA: Diagnosis not present

## 2023-03-08 DIAGNOSIS — F331 Major depressive disorder, recurrent, moderate: Secondary | ICD-10-CM | POA: Diagnosis not present

## 2023-03-08 DIAGNOSIS — F411 Generalized anxiety disorder: Secondary | ICD-10-CM | POA: Diagnosis not present

## 2023-03-15 DIAGNOSIS — F331 Major depressive disorder, recurrent, moderate: Secondary | ICD-10-CM | POA: Diagnosis not present

## 2023-03-15 DIAGNOSIS — F4312 Post-traumatic stress disorder, chronic: Secondary | ICD-10-CM | POA: Diagnosis not present

## 2023-03-15 DIAGNOSIS — F411 Generalized anxiety disorder: Secondary | ICD-10-CM | POA: Diagnosis not present

## 2023-03-15 DIAGNOSIS — F1910 Other psychoactive substance abuse, uncomplicated: Secondary | ICD-10-CM | POA: Diagnosis not present

## 2023-03-29 DIAGNOSIS — F1910 Other psychoactive substance abuse, uncomplicated: Secondary | ICD-10-CM | POA: Diagnosis not present

## 2023-03-29 DIAGNOSIS — F331 Major depressive disorder, recurrent, moderate: Secondary | ICD-10-CM | POA: Diagnosis not present

## 2023-03-29 DIAGNOSIS — F4312 Post-traumatic stress disorder, chronic: Secondary | ICD-10-CM | POA: Diagnosis not present

## 2023-03-29 DIAGNOSIS — F411 Generalized anxiety disorder: Secondary | ICD-10-CM | POA: Diagnosis not present

## 2023-04-10 DIAGNOSIS — F4312 Post-traumatic stress disorder, chronic: Secondary | ICD-10-CM | POA: Diagnosis not present

## 2023-04-10 DIAGNOSIS — F411 Generalized anxiety disorder: Secondary | ICD-10-CM | POA: Diagnosis not present

## 2023-04-10 DIAGNOSIS — F331 Major depressive disorder, recurrent, moderate: Secondary | ICD-10-CM | POA: Diagnosis not present

## 2023-04-26 ENCOUNTER — Ambulatory Visit
Admission: EM | Admit: 2023-04-26 | Discharge: 2023-04-26 | Disposition: A | Discharge status: Home / Self Care | Payer: BC Managed Care – PPO | Attending: Family Medicine | Admitting: Family Medicine

## 2023-04-26 ENCOUNTER — Other Ambulatory Visit: Payer: Self-pay

## 2023-04-26 DIAGNOSIS — R1013 Epigastric pain: Secondary | ICD-10-CM | POA: Diagnosis not present

## 2023-04-26 DIAGNOSIS — R059 Cough, unspecified: Secondary | ICD-10-CM | POA: Insufficient documentation

## 2023-04-26 DIAGNOSIS — Z113 Encounter for screening for infections with a predominantly sexual mode of transmission: Secondary | ICD-10-CM | POA: Diagnosis not present

## 2023-04-26 NOTE — ED Provider Notes (Signed)
[facility]    CSN: [id number] Arrival date & time: 04/26/23  1656      History   Chief Complaint No chief complaint on file.   HPI Tammy Werner is a 42 y.o. female.   Patient presents with two complaints: 1)  She requests STD testing, noting that she is without symptoms. 2)  She reports increased stress during the past several months, and has developed mild epigastric pain about 30 minutes after eating.  She denies nausea/vomiting and bowel movements have been normal.  She reports a cough each morning upon awaking that resolves after clearing her throat, and phlegm in her throat also upon awakening.  She states that she has had GERD in the past..  The history is provided by the patient.    Past Medical History:  Diagnosis Date   Generalized anxiety disorder    GERD (gastroesophageal reflux disease)     Patient Active Problem List   Diagnosis Date Noted   Stress fracture of right tibia 03/25/2020   Dysplasia of cervix 05/20/2014   Tobacco abuse 04/22/2014    Past Surgical History:  Procedure Laterality Date   APPENDECTOMY     TONSILLECTOMY     WISDOM TOOTH EXTRACTION      OB History   No obstetric history on file.      Home Medications    Prior to Admission medications   Medication Sig Start Date End Date Taking? Authorizing Provider  hydrOXYzine (ATARAX) 10 MG tablet Take 10 mg by mouth 3 (three) times daily as needed.   Yes [provider]  sertraline (ZOLOFT) 100 MG tablet Take 75 mg by mouth daily.   Yes [provider]  amoxicillin (AMOXIL) 875 MG tablet Take 1 tablet (875 mg total) by mouth 2 (two) times daily. Take with food 05/13/20   Elvina Sidle, MD  benzonatate (TESSALON) 100 MG capsule Take 1-2 capsules (100-200 mg total) by mouth 3 (three) times daily as needed for cough. 05/13/20   Elvina Sidle, MD  butalbital-acetaminophen-caffeine (FIORICET) [phone] MG tablet Take 1 tablet by mouth daily as needed.  05/04/20   [provider]  escitalopram (LEXAPRO) 10 MG tablet Take 10 mg by mouth daily.    [provider]  ondansetron (ZOFRAN ODT) 4 MG disintegrating tablet Take one tab by mouth Q6hr prn nausea.  Dissolve under tongue. 08/07/19   Lattie Haw, MD  pregabalin (LYRICA) 100 MG capsule Take 1 capsule (100 mg total) by mouth 2 (two) times daily. 04/22/20   Monica Becton, MD    Family History Family History  Problem Relation Age of Onset   Alcohol abuse Father    Alcohol abuse Maternal Aunt    Diabetes Maternal Aunt    Alcohol abuse Maternal Uncle    Alcohol abuse Paternal Aunt    Cancer Maternal Grandmother        skin   Alcohol abuse Maternal Uncle    Alcohol abuse Maternal Uncle    Alcohol abuse Maternal Uncle    Schizophrenia Mother     Social History Social History   Tobacco Use   Smoking status: Every Day    Current packs/day: 0.25    Types: Cigarettes   Smokeless tobacco: Never   Tobacco comments:    down to 2-3 per day, trying to quit again  Vaping Use   Vaping status: Never Used  Substance Use Topics   Alcohol use: Yes    Alcohol/week: 0.0 standard drinks of alcohol  Comment: occassionally   Drug use: Not Currently     Allergies   Patient has no known allergies.   Review of Systems Review of Systems  Constitutional: Negative.   HENT: Negative.    Eyes: Negative.   Respiratory:  Positive for cough. Negative for chest tightness, shortness of breath and wheezing.   Cardiovascular: Negative.   Gastrointestinal:  Positive for abdominal pain. Negative for blood in stool, constipation, diarrhea, nausea and vomiting.  Genitourinary: Negative.   Musculoskeletal: Negative.   Skin: Negative.   Neurological: Negative.   Hematological: Negative.      Physical Exam Triage Vital Signs ED Triage Vitals  Encounter Vitals Group     BP 04/26/23 1700 134/85     Systolic BP Percentile --      Diastolic BP Percentile --       Pulse Rate 04/26/23 1700 94     Resp 04/26/23 1700 16     Temp 04/26/23 1700 98.5 F (36.9 C)     Temp src --      SpO2 04/26/23 1700 98 %     Weight --      Height --      Head Circumference --      Peak Flow --      Pain Score 04/26/23 1703 0     Pain Loc --      Pain Education --      Exclude from Growth Chart --    No data found.  Updated Vital Signs BP 134/85   Pulse 94   Temp 98.5 F (36.9 C)   Resp 16   SpO2 98%   Visual Acuity Right Eye Distance:   Left Eye Distance:   Bilateral Distance:    Right Eye Near:   Left Eye Near:    Bilateral Near:     Physical Exam Nursing notes and Vital Signs reviewed. Appearance:  Patient appears stated age, and in no acute distress Eyes:  Pupils are equal, round, and reactive to light and accomodation.  Extraocular movement is intact.  Conjunctivae are not inflamed  Ears:  Canals normal.  Tympanic membranes normal.  Nose:  Normal turbinates.  No sinus tenderness.    Pharynx:  Normal Neck:  Supple.  No adenopathy.   Lungs:  Clear to auscultation.  Breath sounds are equal.  Moving air well. Heart:  Regular rate and rhythm without murmurs, rubs, or gallops.  Abdomen:  Nontender without masses or hepatosplenomegaly.  Bowel sounds are present.  No CVA or flank tenderness.  Extremities:  No edema.  Skin:  No rash present.   UC Treatments / Results  Labs (all labs ordered are listed, but only abnormal results are displayed) Labs Reviewed  HIV ANTIBODY (ROUTINE TESTING W REFLEX)  RPR  CERVICOVAGINAL ANCILLARY ONLY    EKG   Radiology No results found.  Procedures Procedures (including critical care time)  Medications Ordered in UC Medications - No data to display  Initial Impression / Assessment and Plan / UC Course  I have reviewed the triage vital signs and the nursing notes.  Pertinent labs & imaging results that were available during my care of the patient were reviewed by me and considered in my medical  decision making (see chart for details).    STD testing as above. Suspect GERD.  Recommend trial of Prilosec OTC. Followup with Family Doctor if not improved in two weeks.  Final Clinical Impressions(s) / UC Diagnoses   Final diagnoses:  Screening for STD (  sexually transmitted disease)  Abdominal pain, epigastric     Discharge Instructions      Try taking Prilosec OTC about 20 minutes before each evening meal.    ED Prescriptions   None       Lattie Haw, MD 04/26/23 1929

## 2023-04-26 NOTE — ED Triage Notes (Addendum)
Here for std testing with bloodwork. No symptoms.

## 2023-04-26 NOTE — Discharge Instructions (Addendum)
Try taking Prilosec OTC about 20 minutes before each evening meal.

## 2023-04-28 ENCOUNTER — Telehealth (HOSPITAL_COMMUNITY): Payer: Self-pay

## 2023-04-28 LAB — CERVICOVAGINAL ANCILLARY ONLY
Bacterial Vaginitis (gardnerella): POSITIVE — AB
Candida Glabrata: NEGATIVE
Candida Vaginitis: POSITIVE — AB
Chlamydia: NEGATIVE
Comment: NEGATIVE
Comment: NEGATIVE
Comment: NEGATIVE
Comment: NEGATIVE
Comment: NEGATIVE
Comment: NORMAL
Neisseria Gonorrhea: NEGATIVE
Trichomonas: NEGATIVE

## 2023-04-28 MED ORDER — FLUCONAZOLE 150 MG PO TABS: 150.0000 mg | ORAL_TABLET | Freq: Once | ORAL | 0 refills | Status: AC

## 2023-04-28 NOTE — Telephone Encounter (Signed)
Per protocol, pt requires tx with Diflucan.  Rx sent to pharmacy on file.

## 2023-04-29 LAB — HIV ANTIBODY (ROUTINE TESTING W REFLEX): HIV Screen 4th Generation wRfx: NONREACTIVE

## 2023-05-09 DIAGNOSIS — Z3042 Encounter for surveillance of injectable contraceptive: Secondary | ICD-10-CM | POA: Diagnosis not present

## 2023-05-10 DIAGNOSIS — F411 Generalized anxiety disorder: Secondary | ICD-10-CM | POA: Diagnosis not present

## 2023-05-10 DIAGNOSIS — F331 Major depressive disorder, recurrent, moderate: Secondary | ICD-10-CM | POA: Diagnosis not present

## 2023-05-10 DIAGNOSIS — F4312 Post-traumatic stress disorder, chronic: Secondary | ICD-10-CM | POA: Diagnosis not present

## 2023-07-04 ENCOUNTER — Ambulatory Visit: Payer: BC Managed Care – PPO

## 2023-07-04 ENCOUNTER — Ambulatory Visit (INDEPENDENT_AMBULATORY_CARE_PROVIDER_SITE_OTHER): Payer: BC Managed Care – PPO | Admitting: Sports Medicine

## 2023-07-04 DIAGNOSIS — R220 Localized swelling, mass and lump, head: Secondary | ICD-10-CM | POA: Diagnosis not present

## 2023-07-04 DIAGNOSIS — M25561 Pain in right knee: Secondary | ICD-10-CM

## 2023-07-04 DIAGNOSIS — R2241 Localized swelling, mass and lump, right lower limb: Secondary | ICD-10-CM

## 2023-07-04 NOTE — Progress Notes (Addendum)
    Procedures performed today:    None.  Independent interpretation of notes and tests performed by another provider:   None.  Brief History, Exam, Impression, and Recommendations:    Mass of knee, right This is a very pleasant 43 year old female, for the past several weeks she has noted increasing swelling right knee anterior/lateral aspect, only minimally tender. No trauma, no other new symptoms. On exam she does have fusiform swelling over the fibular head. It is only minimally tender, unofficial office ultrasound does show subcutaneous edema without a well-defined cystic collection. I do see a tract that leads into the lateral joint line. Suspect that this is either a fibular head bursitis, or parameniscal cyst. I would like x-rays today, as she does have a mass we do need to rule out neoplasia so we will go ahead and order an early MRI as well.  I think it is okay to do this without contrast initially.  Update: MRI does show what appears to be an ill-defined structure in the region of concern, potentially a ruptured ganglion cyst, no additional treatment needed at this juncture, observation is acceptable, there is also what appears to be a varicose vein deeper in the musculature.  There are areas of severe cartilage loss under the kneecap with bony edema, this is the likely cause of the above problem.  If this becomes painful or more symptomatic, or does not improve we can consider an injection into the knee joint, otherwise compression, icing and watchful waiting is all we need, physical therapy can also be very effective if okay with her.  I spent 30 minutes of total time managing this patient today, this includes chart review, face to face, and non-face to face time.  ____________________________________________ Ihor Austin. Benjamin Stain, M.D., ABFM., CAQSM., AME. Primary Care and Sports Medicine [facility]  Adjunct Professor of Family Medicine  [employer]  Restaurant manager, fast food

## 2023-07-04 NOTE — Assessment & Plan Note (Addendum)
 This is a very pleasant 43 year old female, for the past several weeks she has noted increasing swelling right knee anterior/lateral aspect, only minimally tender. No trauma, no other new symptoms. On exam she does have fusiform swelling over the fibular head. It is only minimally tender, unofficial office ultrasound does show subcutaneous edema without a well-defined cystic collection. I do see a tract that leads into the lateral joint line. Suspect that this is either a fibular head bursitis, or parameniscal cyst. I would like x-rays today, as she does have a mass we do need to rule out neoplasia so we will go ahead and order an early MRI as well.  I think it is okay to do this without contrast initially.  Update: MRI does show what appears to be an ill-defined structure in the region of concern, potentially a ruptured ganglion cyst, no additional treatment needed at this juncture, observation is acceptable, there is also what appears to be a varicose vein deeper in the musculature.  There are areas of severe cartilage loss under the kneecap with bony edema, this is the likely cause of the above problem.  If this becomes painful or more symptomatic, or does not improve we can consider an injection into the knee joint, otherwise compression, icing and watchful waiting is all we need, physical therapy can also be very effective if okay with her.

## 2023-07-05 DIAGNOSIS — F411 Generalized anxiety disorder: Secondary | ICD-10-CM | POA: Diagnosis not present

## 2023-07-05 DIAGNOSIS — F4312 Post-traumatic stress disorder, chronic: Secondary | ICD-10-CM | POA: Diagnosis not present

## 2023-07-05 DIAGNOSIS — F331 Major depressive disorder, recurrent, moderate: Secondary | ICD-10-CM | POA: Diagnosis not present

## 2023-07-08 ENCOUNTER — Ambulatory Visit: Payer: BC Managed Care – PPO

## 2023-07-08 DIAGNOSIS — R2241 Localized swelling, mass and lump, right lower limb: Secondary | ICD-10-CM | POA: Diagnosis not present

## 2023-07-08 DIAGNOSIS — R609 Edema, unspecified: Secondary | ICD-10-CM | POA: Diagnosis not present

## 2023-07-11 DIAGNOSIS — F4312 Post-traumatic stress disorder, chronic: Secondary | ICD-10-CM | POA: Diagnosis not present

## 2023-07-11 DIAGNOSIS — F411 Generalized anxiety disorder: Secondary | ICD-10-CM | POA: Diagnosis not present

## 2023-07-18 ENCOUNTER — Encounter: Payer: Self-pay | Admitting: Sports Medicine

## 2023-07-19 ENCOUNTER — Telehealth: Payer: Self-pay

## 2023-07-19 DIAGNOSIS — F4312 Post-traumatic stress disorder, chronic: Secondary | ICD-10-CM | POA: Diagnosis not present

## 2023-07-19 DIAGNOSIS — F411 Generalized anxiety disorder: Secondary | ICD-10-CM | POA: Diagnosis not present

## 2023-07-19 NOTE — Telephone Encounter (Signed)
 Copied from CRM [id number]. Topic: Clinical - Lab/Test Results >> Jul 19, 2023 12:02 PM Tammy Werner wrote: Reason for CRM: Patient states she has lab results she is waiting on from Dr. Reyes Ivan to put through to [facility], but no answer. Assuming just went to lunch as it is just 12, didn't realize the time at first. Patient would like a call back at the earliest convenience to go over lab results. Please call primary number on file.

## 2023-07-19 NOTE — Telephone Encounter (Signed)
 The major problems noted on the MRI, the ganglion cyst and cartilage loss in the joint do not heal, we do not have a way to regenerate cartilage in our bodies.  The pain however will decrease over time and if she is not in significant pain then we should really leave this alone for now.  I can definitely suggest some formal or home physical therapy, and of care with her I will upload it as a PDF.

## 2023-07-19 NOTE — Telephone Encounter (Signed)
 Call was not in regards to lab results but instead was imaging results.  She is agreeable to PT being ordered for her.  She wanted to know if the injection mentioned helped with healing or just pain control. She states she is not in a lot of pain and would decline if this was all that the injection did but if aids in healing she would be agreeable to it.

## 2023-07-20 NOTE — Telephone Encounter (Signed)
 Patient informed and would welcome the home PT exercises recommended- requesting these be sent via Mychart.

## 2023-07-25 DIAGNOSIS — F4310 Post-traumatic stress disorder, unspecified: Secondary | ICD-10-CM | POA: Diagnosis not present

## 2023-07-26 DIAGNOSIS — Z3042 Encounter for surveillance of injectable contraceptive: Secondary | ICD-10-CM | POA: Diagnosis not present

## 2023-07-27 DIAGNOSIS — F4310 Post-traumatic stress disorder, unspecified: Secondary | ICD-10-CM | POA: Diagnosis not present

## 2023-07-31 DIAGNOSIS — F4310 Post-traumatic stress disorder, unspecified: Secondary | ICD-10-CM | POA: Diagnosis not present

## 2023-08-03 DIAGNOSIS — F4310 Post-traumatic stress disorder, unspecified: Secondary | ICD-10-CM | POA: Diagnosis not present

## 2023-08-07 DIAGNOSIS — F4310 Post-traumatic stress disorder, unspecified: Secondary | ICD-10-CM | POA: Diagnosis not present

## 2023-08-10 DIAGNOSIS — F4310 Post-traumatic stress disorder, unspecified: Secondary | ICD-10-CM | POA: Diagnosis not present

## 2023-08-14 DIAGNOSIS — F4310 Post-traumatic stress disorder, unspecified: Secondary | ICD-10-CM | POA: Diagnosis not present

## 2023-08-17 DIAGNOSIS — F4310 Post-traumatic stress disorder, unspecified: Secondary | ICD-10-CM | POA: Diagnosis not present

## 2023-08-22 DIAGNOSIS — F4310 Post-traumatic stress disorder, unspecified: Secondary | ICD-10-CM | POA: Diagnosis not present

## 2023-08-31 DIAGNOSIS — F4310 Post-traumatic stress disorder, unspecified: Secondary | ICD-10-CM | POA: Diagnosis not present

## 2023-09-11 DIAGNOSIS — F4310 Post-traumatic stress disorder, unspecified: Secondary | ICD-10-CM | POA: Diagnosis not present

## 2023-09-11 DIAGNOSIS — F331 Major depressive disorder, recurrent, moderate: Secondary | ICD-10-CM | POA: Diagnosis not present

## 2023-09-11 DIAGNOSIS — F411 Generalized anxiety disorder: Secondary | ICD-10-CM | POA: Diagnosis not present

## 2023-09-11 DIAGNOSIS — F4312 Post-traumatic stress disorder, chronic: Secondary | ICD-10-CM | POA: Diagnosis not present

## 2023-09-14 DIAGNOSIS — F4310 Post-traumatic stress disorder, unspecified: Secondary | ICD-10-CM | POA: Diagnosis not present

## 2023-09-18 DIAGNOSIS — F4310 Post-traumatic stress disorder, unspecified: Secondary | ICD-10-CM | POA: Diagnosis not present

## 2023-09-21 DIAGNOSIS — F4310 Post-traumatic stress disorder, unspecified: Secondary | ICD-10-CM | POA: Diagnosis not present

## 2023-09-26 DIAGNOSIS — F4310 Post-traumatic stress disorder, unspecified: Secondary | ICD-10-CM | POA: Diagnosis not present

## 2023-10-03 DIAGNOSIS — F4310 Post-traumatic stress disorder, unspecified: Secondary | ICD-10-CM | POA: Diagnosis not present

## 2023-10-09 DIAGNOSIS — F4312 Post-traumatic stress disorder, chronic: Secondary | ICD-10-CM | POA: Diagnosis not present

## 2023-10-09 DIAGNOSIS — F411 Generalized anxiety disorder: Secondary | ICD-10-CM | POA: Diagnosis not present

## 2023-10-09 DIAGNOSIS — F331 Major depressive disorder, recurrent, moderate: Secondary | ICD-10-CM | POA: Diagnosis not present

## 2023-10-11 DIAGNOSIS — F4310 Post-traumatic stress disorder, unspecified: Secondary | ICD-10-CM | POA: Diagnosis not present

## 2023-10-16 DIAGNOSIS — F4310 Post-traumatic stress disorder, unspecified: Secondary | ICD-10-CM | POA: Diagnosis not present

## 2023-10-26 DIAGNOSIS — F4310 Post-traumatic stress disorder, unspecified: Secondary | ICD-10-CM | POA: Diagnosis not present

## 2023-11-02 DIAGNOSIS — F4310 Post-traumatic stress disorder, unspecified: Secondary | ICD-10-CM | POA: Diagnosis not present

## 2023-11-06 DIAGNOSIS — F4312 Post-traumatic stress disorder, chronic: Secondary | ICD-10-CM | POA: Diagnosis not present

## 2023-11-06 DIAGNOSIS — F411 Generalized anxiety disorder: Secondary | ICD-10-CM | POA: Diagnosis not present

## 2023-11-06 DIAGNOSIS — F331 Major depressive disorder, recurrent, moderate: Secondary | ICD-10-CM | POA: Diagnosis not present

## 2023-11-20 DIAGNOSIS — F4310 Post-traumatic stress disorder, unspecified: Secondary | ICD-10-CM | POA: Diagnosis not present

## 2023-11-23 DIAGNOSIS — Z1231 Encounter for screening mammogram for malignant neoplasm of breast: Secondary | ICD-10-CM | POA: Diagnosis not present

## 2023-11-23 DIAGNOSIS — Z1331 Encounter for screening for depression: Secondary | ICD-10-CM | POA: Diagnosis not present

## 2023-11-23 DIAGNOSIS — R202 Paresthesia of skin: Secondary | ICD-10-CM | POA: Diagnosis not present

## 2023-11-23 DIAGNOSIS — R87810 Cervical high risk human papillomavirus (HPV) DNA test positive: Secondary | ICD-10-CM | POA: Diagnosis not present

## 2023-11-23 DIAGNOSIS — Z01411 Encounter for gynecological examination (general) (routine) with abnormal findings: Secondary | ICD-10-CM | POA: Diagnosis not present

## 2023-11-23 DIAGNOSIS — Z6831 Body mass index (BMI) 31.0-31.9, adult: Secondary | ICD-10-CM | POA: Diagnosis not present

## 2023-11-28 DIAGNOSIS — F4310 Post-traumatic stress disorder, unspecified: Secondary | ICD-10-CM | POA: Diagnosis not present

## 2023-12-05 DIAGNOSIS — F4310 Post-traumatic stress disorder, unspecified: Secondary | ICD-10-CM | POA: Diagnosis not present

## 2023-12-12 DIAGNOSIS — F4310 Post-traumatic stress disorder, unspecified: Secondary | ICD-10-CM | POA: Diagnosis not present

## 2024-01-11 DIAGNOSIS — F3341 Major depressive disorder, recurrent, in partial remission: Secondary | ICD-10-CM | POA: Diagnosis not present

## 2024-01-11 DIAGNOSIS — F4312 Post-traumatic stress disorder, chronic: Secondary | ICD-10-CM | POA: Diagnosis not present

## 2024-01-11 DIAGNOSIS — F411 Generalized anxiety disorder: Secondary | ICD-10-CM | POA: Diagnosis not present

## 2024-01-23 ENCOUNTER — Encounter: Payer: Self-pay | Admitting: Sports Medicine

## 2024-01-30 DIAGNOSIS — F4310 Post-traumatic stress disorder, unspecified: Secondary | ICD-10-CM | POA: Diagnosis not present

## 2024-02-07 DIAGNOSIS — F4310 Post-traumatic stress disorder, unspecified: Secondary | ICD-10-CM | POA: Diagnosis not present

## 2024-02-15 DIAGNOSIS — F4310 Post-traumatic stress disorder, unspecified: Secondary | ICD-10-CM | POA: Diagnosis not present

## 2024-03-05 DIAGNOSIS — F4310 Post-traumatic stress disorder, unspecified: Secondary | ICD-10-CM | POA: Diagnosis not present

## 2024-03-19 DIAGNOSIS — F4310 Post-traumatic stress disorder, unspecified: Secondary | ICD-10-CM | POA: Diagnosis not present

## 2024-04-02 DIAGNOSIS — F4310 Post-traumatic stress disorder, unspecified: Secondary | ICD-10-CM | POA: Diagnosis not present

## 2024-04-04 DIAGNOSIS — F411 Generalized anxiety disorder: Secondary | ICD-10-CM | POA: Diagnosis not present

## 2024-04-04 DIAGNOSIS — F4312 Post-traumatic stress disorder, chronic: Secondary | ICD-10-CM | POA: Diagnosis not present

## 2024-04-04 DIAGNOSIS — F3342 Major depressive disorder, recurrent, in full remission: Secondary | ICD-10-CM | POA: Diagnosis not present

## 2024-04-08 DIAGNOSIS — F4310 Post-traumatic stress disorder, unspecified: Secondary | ICD-10-CM | POA: Diagnosis not present

## 2024-04-16 DIAGNOSIS — F4310 Post-traumatic stress disorder, unspecified: Secondary | ICD-10-CM | POA: Diagnosis not present

## 2024-04-22 DIAGNOSIS — F4310 Post-traumatic stress disorder, unspecified: Secondary | ICD-10-CM | POA: Diagnosis not present

## 2024-05-20 DIAGNOSIS — F4310 Post-traumatic stress disorder, unspecified: Secondary | ICD-10-CM | POA: Diagnosis not present
